# Patient Record
Sex: Male | Born: 1945 | Race: White | Hispanic: No | Marital: Married | Smoking: Former smoker
Health system: Southern US, Community
[De-identification: ages and names within clinical notes are randomized; demographics above are authoritative.]

## PROBLEM LIST (undated history)

## (undated) DIAGNOSIS — M79605 Pain in left leg: Secondary | ICD-10-CM

## (undated) DIAGNOSIS — M79604 Pain in right leg: Secondary | ICD-10-CM

## (undated) DIAGNOSIS — M4800 Spinal stenosis, site unspecified: Secondary | ICD-10-CM

## (undated) HISTORY — DX: Spinal stenosis, site unspecified: M48.00

## (undated) HISTORY — DX: Pain in right leg: M79.604

## (undated) HISTORY — DX: Pain in left leg: M79.605

---
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
## 2011-04-18 ENCOUNTER — Encounter: Payer: Self-pay | Admitting: Gastroenterology

## 2011-05-02 ENCOUNTER — Ambulatory Visit (AMBULATORY_SURGERY_CENTER): Payer: Medicare Other | Admitting: *Deleted

## 2011-05-02 VITALS — Ht 72.0 in | Wt 234.1 lb

## 2011-05-02 DIAGNOSIS — Z1211 Encounter for screening for malignant neoplasm of colon: Secondary | ICD-10-CM

## 2011-05-02 MED ORDER — MOVIPREP 100 G PO SOLR
ORAL | Status: DC
Start: 2011-05-02 — End: 2011-05-21

## 2011-05-03 ENCOUNTER — Encounter: Payer: Self-pay | Admitting: Gastroenterology

## 2011-05-16 ENCOUNTER — Encounter: Payer: Self-pay | Admitting: Gastroenterology

## 2011-05-21 ENCOUNTER — Encounter: Payer: Self-pay | Admitting: Gastroenterology

## 2011-05-21 ENCOUNTER — Ambulatory Visit (AMBULATORY_SURGERY_CENTER): Payer: Medicare Other | Admitting: Gastroenterology

## 2011-05-21 VITALS — BP 140/84 | HR 66 | Temp 97.6°F | Resp 20

## 2011-05-21 DIAGNOSIS — Z1211 Encounter for screening for malignant neoplasm of colon: Secondary | ICD-10-CM

## 2011-05-21 MED ORDER — SODIUM CHLORIDE 0.9 % IV SOLN: 500.0000 mL | INTRAVENOUS | Status: DC

## 2011-05-21 NOTE — Patient Instructions (Signed)
Please follow discharge instructions given today. Resume current medications today. Repeat colonoscopy in 10 years. Diverticulosis seen, try to follow high fiber diet with liberal fluid intake. See handouts given. Call us with any questions or concerns. Thank you!!

## 2011-05-21 NOTE — Progress Notes (Signed)
Patient did not experience any of the following events: a burn prior to discharge; a fall within the facility; wrong site/side/patient/procedure/implant event; or a hospital transfer or hospital admission upon discharge from the facility. (G8907) Patient did not have preoperative order for IV antibiotic SSI prophylaxis. (G8918)  

## 2011-05-21 NOTE — Op Note (Signed)
[facility] [address] [place], [place]  [address]  COLONOSCOPY PROCEDURE REPORT  PATIENT:  Johnny Golden, Johnny Golden  MR#:  #[id number] BIRTHDATE:  08-12-1945, 65 yrs. old  GENDER:  male ENDOSCOPIST:  Vania Rea. Jarold Motto, MD, [facility] REF. BY: PROCEDURE DATE:  05/21/2011 PROCEDURE:  Average-risk screening colonoscopy G0121 ASA CLASS:  Class II INDICATIONS:  Routine Risk Screening MEDICATIONS:   propofol (Diprivan) 250  DESCRIPTION OF PROCEDURE:   After the risks and benefits and of the procedure were explained, informed consent was obtained. Digital rectal exam was performed and revealed no abnormalities. The LB CF-[id number] endoscope was introduced through the anus and advanced to the cecum, which was identified by both the appendix and ileocecal valve.  The quality of the prep was excellent, using MoviPrep.  The instrument was then slowly withdrawn as the colon was fully examined. <<PROCEDUREIMAGES>>  FINDINGS:  There were mild diverticular changes in left colon. diverticulosis was found.  No polyps or cancers were seen. Retroflexed views in the rectum revealed no abnormalities.    The scope was then withdrawn from the patient and the procedure completed.  COMPLICATIONS:  None ENDOSCOPIC IMPRESSION: 1) Diverticulosis,mild,left sided diverticulosis 2) No polyps or cancers RECOMMENDATIONS: 1) Continue current colorectal screening recommendations for "routine risk" patients with a repeat colonoscopy in 10 years.  REPEAT EXAM:  No  ______________________________ Vania Rea. Jarold Motto, MD, [facility]  CC:  n. eSIGNED:   Vania Rea. Patterson at 05/21/2011 10:49 AM  Ladona Horns, #[id number]

## 2011-05-22 ENCOUNTER — Telehealth: Payer: Self-pay

## 2011-05-22 NOTE — Telephone Encounter (Signed)

## 2016-05-15 ENCOUNTER — Ambulatory Visit: Payer: Self-pay | Admitting: Physician Assistant

## 2016-05-28 ENCOUNTER — Encounter: Payer: Self-pay | Admitting: Neurology

## 2016-05-28 ENCOUNTER — Ambulatory Visit (INDEPENDENT_AMBULATORY_CARE_PROVIDER_SITE_OTHER): Payer: Medicare Other | Admitting: Neurology

## 2016-05-28 DIAGNOSIS — M48061 Spinal stenosis, lumbar region without neurogenic claudication: Secondary | ICD-10-CM | POA: Insufficient documentation

## 2016-05-28 DIAGNOSIS — M48062 Spinal stenosis, lumbar region with neurogenic claudication: Secondary | ICD-10-CM | POA: Diagnosis not present

## 2016-05-28 NOTE — Progress Notes (Signed)
PATIENT: Johnny Golden DOB: [birth year]  Chief Complaint  Patient presents with  . Peripheral Neuropathy    He is here with his wife, Zella Ball.  He was diagnosed with spinal stenosis two years ago.  He has only been treated with oral steroids twice.  His bilateral, lower extremity pain has worsened and he also notes weakness in his left leg.  Marland Kitchen PCP    Colon Branch, MD  . Orthopedic    Venita Lick, MD     HISTORICAL  Johnny Golden is a 71 year old right-handed male, seen in refer by  orthopedic surgeon Venita Lick, for evaluation of lumbar stenosis, bilateral lower extremity paresthesia, rule out peripheral neuropathy, his primary care physician will be Dr., Joette Catching, initial evaluation was on May 28 2016.  He had low back pain since 1992, he presented with left-sided low back pain radiating pain to left lower extremity, he was treated with one round of by mouth steroid, had dramatic response, he was diagnosis with lumbar stenosis, was offered surgery then. But because his significant improvement with steroid treatment, he did not seek further medical advice.  Later years, he had occasionally recurrent left-sided low back pain, radiating pain to left lower extremity, was treated with chiropractor, he began to notice left calf muscle atrophy around 2 703.  He began to noticed bilateral lower extremity radiating pain since 2013, gradually getting more frequent, he was treated by pain management Dr. Ethelene Hal around that time, his symptoms again improved by by mouth steroids. But around that time, he noticed right calf muscle atrophy.  He began to have urinary incontinence since 2015, occasionally bowel incontinence, now has to wear pads daily since 2017.  He was reevaluated by orthopedic clinic, EMG nerve conduction study on March 10 2016, by Dr. Silverio Lay, bilateral sural sensory responses showed robust the snap amplitude, with mildly prolonged peak latency, this could due to  cold limb temperature, bilateral tibial motor responses showed severely decreased C map amplitude, left peroneal motor response was absent. Right peroneal motor responses were within normal limits.  Selected needle examination of bilateral lower extremity showed evidence of chronic neuropathic changes, there was some active denervation is at her right medial gastrocnemius, there was no spontaneous activity at bilateral lumbar sacral paraspinal muscles.  He was evaluated by Dr. Shon Baton most recent in December 2017, lumbar decompression surgery is planned for February 2018, he is referred here to rule out peripheral neuropathy.  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, L5-S1 mild retrolisthesis and multifactorial congenital and acquired to severe central canal stenosis with obliteration of the dural sac and S1 nerve roots, there is also severe bilateral neuroforaminal stenosis with mass effect on the existing L5 nerve roots, L4-5 multifactorial congenital and acquired to moderate to severe central canal stenosis without mass effect on the nerve roots,   REVIEW OF SYSTEMS: Full 14 system review of systems performed and notable only for numbness, snoring, incontinence, impotence  ALLERGIES: No Known Allergies  HOME MEDICATIONS: No current outpatient prescriptions on file.   No current facility-administered medications for this visit.     PAST MEDICAL HISTORY: Past Medical History:  Diagnosis Date  . Leg pain, bilateral   . Spinal stenosis     PAST SURGICAL HISTORY: Past Surgical History:  Procedure Laterality Date  . TONSILLECTOMY AND ADENOIDECTOMY  1951    FAMILY HISTORY: Family History  Problem  Relation Age of Onset  . Esophageal cancer Mother   . Other Father     Unsure of history    SOCIAL HISTORY:  Social History   Social History  . Marital status: Married    Spouse  name: N/A  . Number of children: 3  . Years of education: College   Occupational History  . Retired    Social History Main Topics  . Smoking status: Former Smoker    Types: Cigarettes    Quit date: 06/01/2002  . Smokeless tobacco: Never Used  . Alcohol use Yes     Comment: 2-3 beers per week  . Drug use: No  . Sexual activity: Not on file   Other Topics Concern  . Not on file   Social History Narrative   Lives at home with wife.   Right-handed.   2 cups caffeine per day.     PHYSICAL EXAM   Vitals:   05/28/16 1023  BP: 132/84  Pulse: 80  Weight: 241 lb (109.3 kg)  Height: 6' (1.829 m)    Not recorded      Body mass index is 32.69 kg/m.  PHYSICAL EXAMNIATION:  Gen: NAD, conversant, well nourised, obese, well groomed                     Cardiovascular: Regular rate rhythm, no peripheral edema, warm, nontender. Eyes: Conjunctivae clear without exudates or hemorrhage Neck: Supple, no carotid bruits. Pulmonary: Clear to auscultation bilaterally   NEUROLOGICAL EXAM:  MENTAL STATUS: Speech:    Speech is normal; fluent and spontaneous with normal comprehension.  Cognition:     Orientation to time, place and person     Normal recent and remote memory     Normal Attention span and concentration     Normal Language, naming, repeating,spontaneous speech     Fund of knowledge   CRANIAL NERVES: CN II: Visual fields are full to confrontation. Fundoscopic exam is normal with sharp discs and no vascular changes. Pupils are round equal and briskly reactive to light. CN III, IV, VI: extraocular movement are normal. No ptosis. CN V: Facial sensation is intact to pinprick in all 3 divisions bilaterally. Corneal responses are intact.  CN VII: Face is symmetric with normal eye closure and smile. CN VIII: Hearing is normal to rubbing fingers CN IX, X: Palate elevates symmetrically. Phonation is normal. CN XI: Head turning and shoulder shrug are intact CN XII: Tongue is  midline with normal movements and no atrophy.  MOTOR: Significant bilateral calf muscle atrophy, left worse than right, right Hammer toes, mild left toe extension flexion weakness   REFLEXES: Reflexes are 2+ and symmetric at the biceps, triceps, knees, and ankles. Plantar responses are flexor.  SENSORY: Length dependent decreased to  light touch, pinprick and vibratory sensation to distal leg   COORDINATION: Rapid alternating movements and fine finger movements are intact. There is no dysmetria on finger-to-nose and heel-knee-shin.    GAIT/STANCE: Wide based, mildly unsteady gait, he is able to step up on heels, but not on tip toes.    DIAGNOSTIC DATA (LABS, IMAGING, TESTING) - I reviewed patient records, labs, notes, testing and imaging myself where available.   ASSESSMENT AND PLAN  Hampton AbbotJohn M Fajardo is a 71 y.o. male    chronic low back pain radiating pain to bilateral lower extremity, bilateral calf muscle atrophy,   severe L5-S1 canal stenosis, and bilateral foraminal stenosis, moderate L4-L5 canal, and foraminal stenosis.  The well-preserved bilateral  sural sensory snaps argue against the length dependent peripheral neuropathy,  his symptoms are most consistent with lumbar stenosis, severe bilateral S1 radiculopathy.  He would definitely benefit lumbosacral decompression surgery  Levert Feinstein, M.D. Ph.D.  [facility] [address], [address] [place], [place] [address] Ph: [phone] Fax: [phone]  CC: Venita Lick, MD, Joette Catching, MD

## 2016-06-08 ENCOUNTER — Inpatient Hospital Stay (HOSPITAL_COMMUNITY): Admission: RE | Admit: 2016-06-08 | Payer: Self-pay | Source: Ambulatory Visit

## 2016-06-08 ENCOUNTER — Encounter (HOSPITAL_COMMUNITY)
Admission: RE | Admit: 2016-06-08 | Discharge: 2016-06-08 | Disposition: A | Discharge status: Home / Self Care | Payer: Medicare Other | Source: Ambulatory Visit | Attending: Orthopedic Surgery | Admitting: Orthopedic Surgery

## 2016-06-08 ENCOUNTER — Encounter (HOSPITAL_COMMUNITY): Payer: Self-pay

## 2016-06-08 DIAGNOSIS — Z87891 Personal history of nicotine dependence: Secondary | ICD-10-CM | POA: Insufficient documentation

## 2016-06-08 DIAGNOSIS — Z01818 Encounter for other preprocedural examination: Secondary | ICD-10-CM | POA: Insufficient documentation

## 2016-06-08 DIAGNOSIS — M4807 Spinal stenosis, lumbosacral region: Secondary | ICD-10-CM | POA: Insufficient documentation

## 2016-06-08 LAB — BASIC METABOLIC PANEL
Anion gap: 8 (ref 5–15)
BUN: 18 mg/dL (ref 6–20)
CALCIUM: 9.5 mg/dL (ref 8.9–10.3)
CO2: 24 mmol/L (ref 22–32)
CREATININE: 1 mg/dL (ref 0.61–1.24)
Chloride: 105 mmol/L (ref 101–111)
GFR calc Af Amer: >60 mL/min (ref >60)
GLUCOSE: 84 mg/dL (ref 65–99)
Potassium: 4.4 mmol/L (ref 3.5–5.1)
SODIUM: 137 mmol/L (ref 135–145)

## 2016-06-08 LAB — CBC
HCT: 46.7 % (ref 39.0–52.0)
Hemoglobin: 15.7 g/dL (ref 13.0–17.0)
MCH: 31.1 pg (ref 26.0–34.0)
MCHC: 33.6 g/dL (ref 30.0–36.0)
MCV: 92.5 fL (ref 78.0–100.0)
PLATELETS: 188 10*3/uL (ref 150–400)
RBC: 5.05 MIL/uL (ref 4.22–5.81)
RDW: 13.9 % (ref 11.5–15.5)
WBC: 6.8 10*3/uL (ref 4.0–10.5)

## 2016-06-08 LAB — SURGICAL PCR SCREEN
MRSA, PCR: NEGATIVE
Staphylococcus aureus: NEGATIVE

## 2016-06-08 NOTE — Pre-Procedure Instructions (Signed)
Johnny Golden  06/08/2016      [facility] [place] - [address] [place] [address] Phone: [phone] Fax: [phone]    Your procedure is scheduled on   Thursday  06/14/16  [facility] at 530 [facility].M.  Call this number if you have problems the morning of surgery:  [phone]   Remember:  Do not eat food or drink liquids after midnight.  Take these medicines the morning of surgery with A SIP OF WATER   NONE   (STOP 7 DAYS PRIOR TO SURGERY ASPIRIN , IBUPROFEN/ ADVIL/ MOTRIN, GOODY POWDERS, BC'S, HERBAL MEDICINES)   Do not wear jewelry, make-up or nail polish.  Do not wear lotions, powders, or perfumes, or deoderant.  Do not shave 48 hours prior to surgery.  Men may shave face and neck.  Do not bring valuables to the hospital.  [facility] is not responsible for any belongings or valuables.  Contacts, dentures or bridgework may not be worn into surgery.  Leave your suitcase in the car.  After surgery it may be brought to your room.  For patients admitted to the hospital, discharge time will be determined by your treatment team.  Patients discharged the day of surgery will not be allowed to drive home.   Name and phone number of your driver:    Special instructions:  [place] - Preparing for Surgery  Before surgery, you can play an important role.  Because skin is not sterile, your skin needs to be as free of germs as possible.  You can reduce the number of germs on you skin by washing with CHG (chlorahexidine gluconate) soap before surgery.  CHG is an antiseptic cleaner which kills germs and bonds with the skin to continue killing germs even after washing.  Please DO NOT use if you have an allergy to CHG or antibacterial soaps.  If your skin becomes reddened/irritated stop using the CHG and inform your nurse when you arrive at Short Stay.  Do not shave (including legs and underarms) for at least 48 hours  prior to the first CHG shower.  You may shave your face.  Please follow these instructions carefully:   1.  Shower with CHG Soap the night before surgery and the                                morning of Surgery.  2.  If you choose to wash your hair, wash your hair first as usual with your       normal shampoo.  3.  After you shampoo, rinse your hair and body thoroughly to remove the                      Shampoo.  4.  Use CHG as you would any other liquid soap.  You can apply chg directly       to the skin and wash gently with scrungie or a clean washcloth.  5.  Apply the CHG Soap to your body ONLY FROM THE NECK DOWN.        Do not use on open wounds or open sores.  Avoid contact with your eyes,       ears, mouth and genitals (private parts).  Wash genitals (private parts)       with your normal soap.  6.  Wash thoroughly, paying special attention to the area where your surgery        will be performed.  7.  Thoroughly rinse your body with warm water from the neck down.  8.  DO NOT shower/wash with your normal soap after using and rinsing off       the CHG Soap.  9.  Pat yourself dry with a clean towel.            10.  Wear clean pajamas.            11.  Place clean sheets on your bed the night of your first shower and do not        sleep with pets.  Day of Surgery  Do not apply any lotions/deoderants the morning of surgery.  Please wear clean clothes to the hospital/surgery center.    Please read over the following fact sheets that you were given. MRSA Information and Surgical Site Infection Prevention

## 2016-06-11 NOTE — Progress Notes (Signed)
Anesthesia Chart Review:  Pt is a 71 year old male scheduled for L3-S1 lumbar laminectomy/ decompression microdiscectomy on 06/14/2016 with Venita Lickahari Brooks, MD  - PCP is Joette CatchingLeonard Nyland, MD  PMH includes:  Spinal stenosisFormer smoker. BMI 33.   Medications reviewed.   Preoperative labs reviewed.  K 4.4, hemolyzed specimen. Will get I-stat 4 DOS.   If labs acceptable DOS, I anticipate pt can proceed as scheduled.   Rica Mastngela Kabbe, FNP-BC [facility]/Anesthesiology Phone: [phone] 06/11/2016 3:52 PM

## 2016-06-13 NOTE — Anesthesia Preprocedure Evaluation (Addendum)
Anesthesia Evaluation  Patient identified by MRN, date of birth, ID band Patient awake    Reviewed: Allergy & Precautions, H&P , NPO status , Patient's Chart, lab work & pertinent test results  Airway Mallampati: II  TM Distance: >3 FB Neck ROM: Full    Dental no notable dental hx. (+) Teeth Intact, Dental Advisory Given   Pulmonary neg pulmonary ROS, former smoker,    Pulmonary exam normal breath sounds clear to auscultation       Cardiovascular Exercise Tolerance: Good negative cardio ROS   Rhythm:Regular Rate:Normal     Neuro/Psych negative neurological ROS  negative psych ROS   GI/Hepatic negative GI ROS, Neg liver ROS,   Endo/Other  negative endocrine ROS  Renal/GU negative Renal ROS  negative genitourinary   Musculoskeletal   Abdominal   Peds  Hematology negative hematology ROS (+)   Anesthesia Other Findings   Reproductive/Obstetrics negative OB ROS                            Anesthesia Physical Anesthesia Plan  ASA: II  Anesthesia Plan: General   Post-op Pain Management:    Induction: Intravenous  Airway Management Planned: Oral ETT  Additional Equipment:   Intra-op Plan:   Post-operative Plan: Extubation in OR  Informed Consent: I have reviewed the patients History and Physical, chart, labs and discussed the procedure including the risks, benefits and alternatives for the proposed anesthesia with the patient or authorized representative who has indicated his/her understanding and acceptance.   Dental advisory given  Plan Discussed with: CRNA  Anesthesia Plan Comments:         Anesthesia Quick Evaluation

## 2016-06-14 ENCOUNTER — Encounter (HOSPITAL_COMMUNITY)
Admission: RE | Disposition: A | Discharge status: Home / Self Care | Payer: Self-pay | Source: Ambulatory Visit | Attending: Orthopedic Surgery

## 2016-06-14 ENCOUNTER — Ambulatory Visit (HOSPITAL_COMMUNITY): Payer: Medicare Other | Admitting: Emergency Medicine

## 2016-06-14 ENCOUNTER — Encounter (HOSPITAL_COMMUNITY): Payer: Self-pay | Admitting: Surgery

## 2016-06-14 ENCOUNTER — Observation Stay (HOSPITAL_COMMUNITY)
Admission: RE | Admit: 2016-06-14 | Discharge: 2016-06-15 | Disposition: A | Discharge status: Home / Self Care | Payer: Medicare Other | Source: Ambulatory Visit | Attending: Orthopedic Surgery | Admitting: Orthopedic Surgery

## 2016-06-14 ENCOUNTER — Ambulatory Visit (HOSPITAL_COMMUNITY): Payer: Medicare Other

## 2016-06-14 DIAGNOSIS — M5442 Lumbago with sciatica, left side: Secondary | ICD-10-CM | POA: Diagnosis not present

## 2016-06-14 DIAGNOSIS — M48062 Spinal stenosis, lumbar region with neurogenic claudication: Secondary | ICD-10-CM | POA: Diagnosis not present

## 2016-06-14 DIAGNOSIS — M549 Dorsalgia, unspecified: Secondary | ICD-10-CM | POA: Diagnosis present

## 2016-06-14 DIAGNOSIS — Z87891 Personal history of nicotine dependence: Secondary | ICD-10-CM | POA: Insufficient documentation

## 2016-06-14 DIAGNOSIS — M5441 Lumbago with sciatica, right side: Secondary | ICD-10-CM | POA: Diagnosis not present

## 2016-06-14 DIAGNOSIS — Z419 Encounter for procedure for purposes other than remedying health state, unspecified: Secondary | ICD-10-CM

## 2016-06-14 HISTORY — PX: LUMBAR LAMINECTOMY/DECOMPRESSION MICRODISCECTOMY: SHX5026

## 2016-06-14 LAB — POCT I-STAT 4, (NA,K, GLUC, HGB,HCT)
Glucose, Bld: 106 mg/dL — ABNORMAL HIGH (ref 65–99)
HEMATOCRIT: 45 % (ref 39.0–52.0)
HEMOGLOBIN: 15.3 g/dL (ref 13.0–17.0)
Potassium: 4.2 mmol/L (ref 3.5–5.1)
Sodium: 143 mmol/L (ref 135–145)

## 2016-06-14 SURGERY — LUMBAR LAMINECTOMY/DECOMPRESSION MICRODISCECTOMY 3 LEVELS
Anesthesia: General | Site: Spine Lumbar

## 2016-06-14 MED ORDER — POLYETHYLENE GLYCOL 3350 17 G PO PACK: 17.0000 g | PACK | Freq: Every day | ORAL | Status: DC | PRN

## 2016-06-14 MED ORDER — MIDAZOLAM HCL 2 MG/2ML IJ SOLN
INTRAMUSCULAR | Status: AC
  Filled 2016-06-14: qty 2

## 2016-06-14 MED ORDER — FENTANYL CITRATE (PF) 100 MCG/2ML IJ SOLN
INTRAMUSCULAR | Status: AC
  Filled 2016-06-14: qty 2

## 2016-06-14 MED ORDER — MIDAZOLAM HCL 5 MG/5ML IJ SOLN
INTRAMUSCULAR | Status: DC | PRN
  Administered 2016-06-14: 2 mg via INTRAVENOUS

## 2016-06-14 MED ORDER — THROMBIN 20000 UNITS EX KIT
PACK | CUTANEOUS | Status: DC | PRN
  Administered 2016-06-14: 20000 [IU] via TOPICAL

## 2016-06-14 MED ORDER — PHENOL 1.4 % MT LIQD: 1.0000 | OROMUCOSAL | Status: DC | PRN

## 2016-06-14 MED ORDER — DEXAMETHASONE 4 MG PO TABS
4.0000 mg | ORAL_TABLET | Freq: Four times a day (QID) | ORAL | Status: AC
  Administered 2016-06-14 (×2): 4 mg via ORAL
  Filled 2016-06-14 (×2): qty 1

## 2016-06-14 MED ORDER — PHENYLEPHRINE 40 MCG/ML (10ML) SYRINGE FOR IV PUSH (FOR BLOOD PRESSURE SUPPORT)
PREFILLED_SYRINGE | INTRAVENOUS | Status: AC
  Filled 2016-06-14: qty 10

## 2016-06-14 MED ORDER — HYDROMORPHONE HCL 1 MG/ML IJ SOLN: 0.2500 mg | INTRAMUSCULAR | Status: DC | PRN

## 2016-06-14 MED ORDER — PHENYLEPHRINE HCL 10 MG/ML IJ SOLN
INTRAMUSCULAR | Status: DC | PRN
  Administered 2016-06-14 (×3): 80 ug via INTRAVENOUS

## 2016-06-14 MED ORDER — DEXAMETHASONE SODIUM PHOSPHATE 4 MG/ML IJ SOLN: 4.0000 mg | Freq: Four times a day (QID) | INTRAMUSCULAR | Status: AC

## 2016-06-14 MED ORDER — ROCURONIUM BROMIDE 50 MG/5ML IV SOSY
PREFILLED_SYRINGE | INTRAVENOUS | Status: AC
  Filled 2016-06-14: qty 5

## 2016-06-14 MED ORDER — MORPHINE SULFATE (PF) 2 MG/ML IV SOLN: 2.0000 mg | INTRAVENOUS | Status: DC | PRN

## 2016-06-14 MED ORDER — ACETAMINOPHEN 325 MG PO TABS: 650.0000 mg | ORAL_TABLET | ORAL | Status: DC | PRN

## 2016-06-14 MED ORDER — ACETAMINOPHEN 650 MG RE SUPP: 650.0000 mg | RECTAL | Status: DC | PRN

## 2016-06-14 MED ORDER — ONDANSETRON HCL 4 MG PO TABS: 4.0000 mg | ORAL_TABLET | Freq: Three times a day (TID) | ORAL | 0 refills | Status: AC | PRN

## 2016-06-14 MED ORDER — OXYCODONE HCL 5 MG PO TABS
10.0000 mg | ORAL_TABLET | ORAL | Status: DC | PRN
  Administered 2016-06-14 – 2016-06-15 (×4): 10 mg via ORAL
  Filled 2016-06-14 (×4): qty 2

## 2016-06-14 MED ORDER — LIDOCAINE HCL (CARDIAC) 20 MG/ML IV SOLN
INTRAVENOUS | Status: DC | PRN
  Administered 2016-06-14: 60 mg via INTRAVENOUS

## 2016-06-14 MED ORDER — MENTHOL 3 MG MT LOZG: 1.0000 | LOZENGE | OROMUCOSAL | Status: DC | PRN

## 2016-06-14 MED ORDER — EPHEDRINE 5 MG/ML INJ
INTRAVENOUS | Status: AC
  Filled 2016-06-14: qty 10

## 2016-06-14 MED ORDER — LACTATED RINGERS IV SOLN
INTRAVENOUS | Status: DC | PRN
  Administered 2016-06-14 (×3): via INTRAVENOUS

## 2016-06-14 MED ORDER — LACTATED RINGERS IV SOLN: INTRAVENOUS | Status: DC

## 2016-06-14 MED ORDER — CEFAZOLIN SODIUM-DEXTROSE 2-4 GM/100ML-% IV SOLN
2.0000 g | INTRAVENOUS | Status: AC
  Administered 2016-06-14 (×2): 2 g via INTRAVENOUS
  Filled 2016-06-14: qty 100

## 2016-06-14 MED ORDER — PROPOFOL 10 MG/ML IV BOLUS
INTRAVENOUS | Status: AC
  Filled 2016-06-14: qty 20

## 2016-06-14 MED ORDER — SODIUM CHLORIDE 0.9% FLUSH: 3.0000 mL | INTRAVENOUS | Status: DC | PRN

## 2016-06-14 MED ORDER — HYDROCODONE-ACETAMINOPHEN 10-325 MG PO TABS: 1.0000 | ORAL_TABLET | Freq: Four times a day (QID) | ORAL | 0 refills | Status: DC | PRN

## 2016-06-14 MED ORDER — EPHEDRINE SULFATE 50 MG/ML IJ SOLN
INTRAMUSCULAR | Status: DC | PRN
  Administered 2016-06-14 (×2): 10 mg via INTRAVENOUS

## 2016-06-14 MED ORDER — METHOCARBAMOL 500 MG PO TABS
500.0000 mg | ORAL_TABLET | Freq: Four times a day (QID) | ORAL | Status: DC | PRN
  Administered 2016-06-14 – 2016-06-15 (×3): 500 mg via ORAL
  Filled 2016-06-14 (×4): qty 1

## 2016-06-14 MED ORDER — PROPOFOL 10 MG/ML IV BOLUS
INTRAVENOUS | Status: DC | PRN
  Administered 2016-06-14: 130 mg via INTRAVENOUS
  Administered 2016-06-14: 40 mg via INTRAVENOUS

## 2016-06-14 MED ORDER — METHOCARBAMOL 500 MG PO TABS: 500.0000 mg | ORAL_TABLET | Freq: Three times a day (TID) | ORAL | 0 refills | Status: DC | PRN

## 2016-06-14 MED ORDER — HEMOSTATIC AGENTS (NO CHARGE) OPTIME
TOPICAL | Status: DC | PRN
  Administered 2016-06-14 (×2): 1 via TOPICAL

## 2016-06-14 MED ORDER — DEXAMETHASONE SODIUM PHOSPHATE 10 MG/ML IJ SOLN
INTRAMUSCULAR | Status: AC
  Filled 2016-06-14: qty 1

## 2016-06-14 MED ORDER — METHOCARBAMOL 1000 MG/10ML IJ SOLN
500.0000 mg | Freq: Four times a day (QID) | INTRAVENOUS | Status: DC | PRN
  Filled 2016-06-14: qty 5

## 2016-06-14 MED ORDER — SUGAMMADEX SODIUM 500 MG/5ML IV SOLN
INTRAVENOUS | Status: DC | PRN
  Administered 2016-06-14: 200 mg via INTRAVENOUS

## 2016-06-14 MED ORDER — BUPIVACAINE-EPINEPHRINE 0.25% -1:200000 IJ SOLN
INTRAMUSCULAR | Status: DC | PRN
  Administered 2016-06-14: 10 mL

## 2016-06-14 MED ORDER — ACETAMINOPHEN 10 MG/ML IV SOLN
1000.0000 mg | Freq: Four times a day (QID) | INTRAVENOUS | Status: DC
  Administered 2016-06-14 (×2): 1000 mg via INTRAVENOUS
  Filled 2016-06-14 (×2): qty 100

## 2016-06-14 MED ORDER — DEXAMETHASONE SODIUM PHOSPHATE 10 MG/ML IJ SOLN
INTRAMUSCULAR | Status: DC | PRN
  Administered 2016-06-14: 10 mg via INTRAVENOUS

## 2016-06-14 MED ORDER — SODIUM CHLORIDE 0.9% FLUSH
3.0000 mL | Freq: Two times a day (BID) | INTRAVENOUS | Status: DC
  Administered 2016-06-14 (×2): 3 mL via INTRAVENOUS

## 2016-06-14 MED ORDER — ONDANSETRON HCL 4 MG/2ML IJ SOLN
4.0000 mg | INTRAMUSCULAR | Status: DC | PRN
  Filled 2016-06-14: qty 2

## 2016-06-14 MED ORDER — THROMBIN 20000 UNITS EX SOLR
CUTANEOUS | Status: AC
  Filled 2016-06-14: qty 20000

## 2016-06-14 MED ORDER — ONDANSETRON HCL 4 MG/2ML IJ SOLN
INTRAMUSCULAR | Status: DC | PRN
Start: 2016-06-14 — End: 2016-06-14
  Administered 2016-06-14: 4 mg via INTRAVENOUS

## 2016-06-14 MED ORDER — LIDOCAINE 2% (20 MG/ML) 5 ML SYRINGE
INTRAMUSCULAR | Status: AC
  Filled 2016-06-14: qty 5

## 2016-06-14 MED ORDER — ROCURONIUM BROMIDE 100 MG/10ML IV SOLN
INTRAVENOUS | Status: DC | PRN
  Administered 2016-06-14 (×2): 20 mg via INTRAVENOUS
  Administered 2016-06-14: 50 mg via INTRAVENOUS
  Administered 2016-06-14: 10 mg via INTRAVENOUS
  Administered 2016-06-14: 20 mg via INTRAVENOUS

## 2016-06-14 MED ORDER — CEFAZOLIN SODIUM-DEXTROSE 2-4 GM/100ML-% IV SOLN
2.0000 g | Freq: Three times a day (TID) | INTRAVENOUS | Status: AC
  Administered 2016-06-14 – 2016-06-15 (×2): 2 g via INTRAVENOUS
  Filled 2016-06-14 (×2): qty 100

## 2016-06-14 MED ORDER — FENTANYL CITRATE (PF) 100 MCG/2ML IJ SOLN
INTRAMUSCULAR | Status: DC | PRN
  Administered 2016-06-14 (×3): 50 ug via INTRAVENOUS
  Administered 2016-06-14 (×2): 100 ug via INTRAVENOUS
  Administered 2016-06-14: 50 ug via INTRAVENOUS

## 2016-06-14 SURGICAL SUPPLY — 65 items
BUR EGG ELITE 4.0 (BURR) IMPLANT
BUR EGG ELITE 4.0MM (BURR)
BUR MATCHSTICK NEURO 3.0 LAGG (BURR) IMPLANT
CANISTER SUCTION 2500CC (MISCELLANEOUS) ×3 IMPLANT
CLOSURE STERI-STRIP 1/2X4 (GAUZE/BANDAGES/DRESSINGS) ×1
CLOSURE WOUND 1/2 X4 (GAUZE/BANDAGES/DRESSINGS)
CLSR STERI-STRIP ANTIMIC 1/2X4 (GAUZE/BANDAGES/DRESSINGS) ×2 IMPLANT
CORDS BIPOLAR (ELECTRODE) ×3 IMPLANT
COVER SURGICAL LIGHT HANDLE (MISCELLANEOUS) ×3 IMPLANT
DERMABOND ADVANCED (GAUZE/BANDAGES/DRESSINGS)
DERMABOND ADVANCED .7 DNX12 (GAUZE/BANDAGES/DRESSINGS) IMPLANT
DRAIN CHANNEL 15F RND FF W/TCR (WOUND CARE) ×3 IMPLANT
DRAPE POUCH INSTRU U-SHP 10X18 (DRAPES) ×3 IMPLANT
DRAPE SURG 17X23 STRL (DRAPES) ×3 IMPLANT
DRAPE U-SHAPE 47X51 STRL (DRAPES) ×3 IMPLANT
DRSG AQUACEL AG ADV 3.5X10 (GAUZE/BANDAGES/DRESSINGS) ×3 IMPLANT
DRSG AQUACEL AG ADV 3.5X14 (GAUZE/BANDAGES/DRESSINGS) ×3 IMPLANT
DURAPREP 26ML APPLICATOR (WOUND CARE) ×3 IMPLANT
ELECT BLADE 4.0 EZ CLEAN MEGAD (MISCELLANEOUS)
ELECT CAUTERY BLADE 6.4 (BLADE) ×3 IMPLANT
ELECT PENCIL ROCKER SW 15FT (MISCELLANEOUS) ×3 IMPLANT
ELECT REM PT RETURN 9FT ADLT (ELECTROSURGICAL) ×3
ELECTRODE BLDE 4.0 EZ CLN MEGD (MISCELLANEOUS) IMPLANT
ELECTRODE REM PT RTRN 9FT ADLT (ELECTROSURGICAL) ×1 IMPLANT
EVACUATOR 1/8 PVC DRAIN (DRAIN) IMPLANT
EVACUATOR SILICONE 100CC (DRAIN) ×3 IMPLANT
GLOVE BIO SURGEON STRL SZ 6.5 (GLOVE) ×2 IMPLANT
GLOVE BIO SURGEONS STRL SZ 6.5 (GLOVE) ×1
GLOVE BIOGEL PI IND STRL 6.5 (GLOVE) ×1 IMPLANT
GLOVE BIOGEL PI IND STRL 8.5 (GLOVE) ×1 IMPLANT
GLOVE BIOGEL PI INDICATOR 6.5 (GLOVE) ×2
GLOVE BIOGEL PI INDICATOR 8.5 (GLOVE) ×2
GLOVE SS BIOGEL STRL SZ 8.5 (GLOVE) ×1 IMPLANT
GLOVE SUPERSENSE BIOGEL SZ 8.5 (GLOVE) ×2
GOWN STRL REUS W/ TWL LRG LVL3 (GOWN DISPOSABLE) ×1 IMPLANT
GOWN STRL REUS W/TWL 2XL LVL3 (GOWN DISPOSABLE) ×6 IMPLANT
GOWN STRL REUS W/TWL LRG LVL3 (GOWN DISPOSABLE) ×2
KIT BASIN OR (CUSTOM PROCEDURE TRAY) ×3 IMPLANT
KIT ROOM TURNOVER OR (KITS) ×3 IMPLANT
NEEDLE 22X1 1/2 (OR ONLY) (NEEDLE) ×3 IMPLANT
NEEDLE SPNL 18GX3.5 QUINCKE PK (NEEDLE) ×6 IMPLANT
NS IRRIG 1000ML POUR BTL (IV SOLUTION) ×3 IMPLANT
PACK LAMINECTOMY ORTHO (CUSTOM PROCEDURE TRAY) ×3 IMPLANT
PACK UNIVERSAL I (CUSTOM PROCEDURE TRAY) ×3 IMPLANT
PAD ARMBOARD 7.5X6 YLW CONV (MISCELLANEOUS) ×6 IMPLANT
PATTIES SURGICAL .5 X.5 (GAUZE/BANDAGES/DRESSINGS) IMPLANT
PATTIES SURGICAL .5 X1 (DISPOSABLE) IMPLANT
SPONGE SURGIFOAM ABS GEL 100 (HEMOSTASIS) IMPLANT
STRIP CLOSURE SKIN 1/2X4 (GAUZE/BANDAGES/DRESSINGS) IMPLANT
SURGIFLO W/THROMBIN 8M KIT (HEMOSTASIS) ×6 IMPLANT
SUT BONE WAX W31G (SUTURE) ×3 IMPLANT
SUT MON AB 3-0 SH 27 (SUTURE) ×2
SUT MON AB 3-0 SH27 (SUTURE) ×1 IMPLANT
SUT STRATAFIX 1PDS 45CM VIOLET (SUTURE) ×3 IMPLANT
SUT VIC AB 0 CT1 27 (SUTURE) ×2
SUT VIC AB 0 CT1 27XBRD ANBCTR (SUTURE) ×1 IMPLANT
SUT VIC AB 1 CTX 36 (SUTURE) ×4
SUT VIC AB 1 CTX36XBRD ANBCTR (SUTURE) ×2 IMPLANT
SUT VIC AB 2-0 CT1 18 (SUTURE) ×3 IMPLANT
SYR BULB IRRIGATION 50ML (SYRINGE) ×3 IMPLANT
SYR CONTROL 10ML LL (SYRINGE) ×3 IMPLANT
TOWEL OR 17X24 6PK STRL BLUE (TOWEL DISPOSABLE) ×3 IMPLANT
TOWEL OR 17X26 10 PK STRL BLUE (TOWEL DISPOSABLE) ×3 IMPLANT
WATER STERILE IRR 1000ML POUR (IV SOLUTION) ×3 IMPLANT
YANKAUER SUCT BULB TIP NO VENT (SUCTIONS) ×3 IMPLANT

## 2016-06-14 NOTE — Brief Op Note (Signed)
06/14/2016  11:27 AM  PATIENT:  Johnny BandaJohn M Golden  71 y.o. male  PRE-OPERATIVE DIAGNOSIS:  Lumbar spinal stenosis L3-S1  POST-OPERATIVE DIAGNOSIS:  Spinal Stenosis Lumbar three-Sacral one  PROCEDURE:  Procedure(s) with comments: Lumbar decompression L3-S1 LUMBAR LAMINECTOMY/DECOMPRESSION MICRODISCECTOMY 3 LEVELS (N/A) - Requests 3 hrs  SURGEON:  Surgeon(s) and Role:    * Venita Lickahari Arwyn Besaw, MD - Primary  PHYSICIAN ASSISTANT:   ASSISTANTS: Carmen Mayo   ANESTHESIA:   general  EBL:  Total I/O In: 1000 [I.V.:1000] Out: 1050 [Urine:850; Blood:200]  BLOOD ADMINISTERED:none  DRAINS: 1 drain in back   LOCAL MEDICATIONS USED:  MARCAINE     SPECIMEN:  No Specimen  DISPOSITION OF SPECIMEN:  N/A  COUNTS:  YES  TOURNIQUET:  * No tourniquets in log *  DICTATION: .Other Dictation: Dictation Number [id number]  PLAN OF CARE: Admit for overnight observation  PATIENT DISPOSITION:  PACU - hemodynamically stable.

## 2016-06-14 NOTE — H&P (Signed)
History of Present Illness  The patient is a 71 year old male who comes in today for a preoperative History and Physical. The patient is scheduled for a L3-S1 Lumbar Decompression to be performed by Dr. Debria Garret D. Shon Baton, MD at [facility] on 06/14/2016 . Please see the hospital record for complete dictated history and physical. Pt reports a hx of good health.   Problem List/Past Medical Spinal Stenosis, Lumbar (724.02) (M48.061)  Radicular lumbar or thoracic pain (M54.10)  Chronic right-sided low back pain with bilateral sciatica (M54.41, M54.42)  Sprain/Strain, Lumbar  (Marked as Inactive) Problems Reconciled   Allergies  No Known Drug Allergies  Allergies Reconciled   Family History Cancer  mother and grandfather fathers side Cerebrovascular Accident  grandmother fathers side Congestive Heart Failure  grandmother mothers side  Social History  Tobacco / smoke exposure  no Tobacco use  former smoker; smoke(d) 1 pack(s) per day Alcohol use  current drinker; drinks beer; only occasionally per week Children  0 Current work status  retired Financial planner (Currently)  no Drug/Alcohol Rehab (Previously)  no Exercise  Exercises daily; does running / walking Illicit drug use  no Living situation  live with spouse Marital status  married Number of flights of stairs before winded  greater than 5 Pain Contract  no  Medication History (Sue M. Toomes; 06/05/2016 1:52 PM) No Current Medications  Vitals  06/05/2016 1:57 PM Weight: 244 lb Height: 72.75in Body Surface Area: 2.34 m Body Mass Index: 32.41 kg/m  Temp.: 98.3F  Pulse: 68 (Regular)  BP: 160/105 (Sitting, Left Arm, Standard) w/shoes smt  General General Appearance-Not in acute distress. Orientation-Oriented X3. Build & Nutrition-Well nourished and Well developed.  Integumentary General Characteristics Surgical Scars - no surgical scar evidence of previous lumbar  surgery. Lumbar Spine-Skin examination of the lumbar spine is without deformity, skin lesions, lacerations or abrasions.  Chest and Lung Exam Auscultation Breath sounds - Normal and Clear.  Cardiovascular Auscultation Rhythm - Regular rate and rhythm.  Abdomen Palpation/Percussion Palpation and Percussion of the abdomen reveal - Soft, Non Tender and No Rebound tenderness.  Peripheral Vascular Lower Extremity Palpation - Posterior tibial pulse - Bilateral - 2+. Dorsalis pedis pulse - Bilateral - 2+.  Neurologic Sensation Lower Extremity - Bilateral - sensation is intact in the lower extremity. Reflexes Patellar Reflex - Bilateral - 2+. Achilles Reflex - Bilateral - 2+. Clonus - Bilateral - clonus not present. Hoffman's Sign - Bilateral - Hoffman's sign not present. Testing Seated Straight Leg Raise - Bilateral - Seated straight leg raise negative.  Musculoskeletal Spine/Ribs/Pelvis  Lumbosacral Spine: Inspection and Palpation - Tenderness - left lumbar paraspinals tender to palpation, right lumbar paraspinals tender to palpation, left buttock is tender to palpation and right buttock is tender to palpation. Strength and Tone: Strength - Hip Flexion - Bilateral - 5/5. Knee Extension - Bilateral - 5/5. Knee Flexion - Bilateral - 5/5. Ankle Dorsiflexion - Left - 3/5. Right - 5/5. Ankle Plantarflexion - Bilateral - 5/5. Heel walk - Bilateral - unable to heel walk. Toe Walk - Bilateral - unable to walk on toes. Heel-Toe Walk - Bilateral - able to heel-toe walk without difficulty. ROM - Flexion - mildly decreased range of motion. Extension - moderately decreased range of motion and painful. Left Lateral Bending - moderately decreased range of motion and painful. Right Lateral Bending - moderately decreased range of motion and painful. Right Rotation - moderately decreased range of motion and painful. Left Rotation - moderately decreased range of  motion and painful. Pain - neither flexion or  extension is more painful than the other. Lumbosacral Spine - Waddell's Signs - no Waddell's signs present. Lower Extremity Range of Motion - No true hip, knee or ankle pain with range of motion. Gait and Station - [employer] - no assistive devices.  Imaging from the patient's MRI from 02/23/2016 was compared to 06/19/2013 MRI. He has severe spinal stenosis at L5-S1 with complete obliteration of the canal. No visible sign of the thecal sac at that level with significance sacral nerve root compression. Severe stenosis at L3-4 and L4-5. Mass effect on nerves again, unchanged from 2015.  Assessment & Plan   Posterior Lumbar Decompression/disectomy: Risks of surgery include infection, bleeding, nerve damage, death, stroke, paralysis, failure to heal, need for further surgery, ongoing or worse pain, need for further surgery, CSF leak, loss of bowel or bladder, and recurrent disc herniation or Stenosis which would necessitate need for further surgery.  Goal Of Surgery: Discussed that goal of surgery is to reduce pain and improve function and quality of life. Patient is aware that despite all appropriate treatment that there pain and function could be the same, worse, or different. Chronic right-sided low back pain with bilateral sciatica   Patient with significant spinal stenosis with neurogenic claudication.  Plan on multilevel lumbar decompression.

## 2016-06-14 NOTE — Transfer of Care (Signed)
Immediate Anesthesia Transfer of Care Note  Patient: Johnny BandaJohn M Reppucci  Procedure(s) Performed: Procedure(s) with comments: Lumbar decompression L3-S1 LUMBAR LAMINECTOMY/DECOMPRESSION MICRODISCECTOMY 3 LEVELS (N/A) - Requests 3 hrs  Patient Location: PACU  Anesthesia Type:General  Level of Consciousness: awake  Airway & Oxygen Therapy: Patient Spontanous Breathing and Patient connected to nasal cannula oxygen  Post-op Assessment: Report given to RN and Post -op Vital signs reviewed and stable  Post vital signs: Reviewed and stable  Last Vitals:  Vitals:   06/14/16 0609 06/14/16 1210  BP: (!) 144/76   Pulse: 62   Resp: 20   Temp: 36.8 C (P) 36.5 C    Last Pain:  Vitals:   06/14/16 0609  TempSrc: Oral      Patients Stated Pain Goal: 3 (06/14/16 0644)  Complications: No apparent anesthesia complications

## 2016-06-14 NOTE — Anesthesia Procedure Notes (Signed)
Procedure Name: Intubation Date/Time: 06/14/2016 7:35 AM Performed by: Manus Gunning, Masaichi Kracht J Pre-anesthesia Checklist: Patient identified, Emergency Drugs available, Suction available, Patient being monitored and Timeout performed Patient Re-evaluated:Patient Re-evaluated prior to inductionOxygen Delivery Method: Circle system utilized Preoxygenation: Pre-oxygenation with 100% oxygen Intubation Type: IV induction Ventilation: Mask ventilation without difficulty Laryngoscope Size: Mac and 4 Grade View: Grade II Tube type: Oral Tube size: 7.5 mm Number of attempts: 1 Placement Confirmation: ETT inserted through vocal cords under direct vision,  positive ETCO2 and breath sounds checked- equal and bilateral Secured at: 22 cm Tube secured with: Tape Dental Injury: Teeth and Oropharynx as per pre-operative assessment

## 2016-06-14 NOTE — Anesthesia Postprocedure Evaluation (Signed)
Anesthesia Post Note  Patient: Margit BandaJohn M Trivett  Procedure(s) Performed: Procedure(s) (LRB): Lumbar decompression L3-S1 LUMBAR LAMINECTOMY/DECOMPRESSION MICRODISCECTOMY 3 LEVELS (N/A)  Patient location during evaluation: PACU Anesthesia Type: General Level of consciousness: awake and alert Pain management: pain level controlled Vital Signs Assessment: post-procedure vital signs reviewed and stable Respiratory status: spontaneous breathing, nonlabored ventilation, respiratory function stable and patient connected to nasal cannula oxygen Cardiovascular status: blood pressure returned to baseline and stable Postop Assessment: no signs of nausea or vomiting Anesthetic complications: no       Last Vitals:  Vitals:   06/14/16 1325 06/14/16 1340  BP: 119/78 137/74  Pulse: 85 75  Resp: 13 11  Temp:      Last Pain:  Vitals:   06/14/16 1325  TempSrc:   PainSc: 5                  Johanna Stafford,W. EDMOND

## 2016-06-14 NOTE — Evaluation (Signed)
Occupational Therapy Evaluation and Discharge Patient Details Name: Johnny Golden MRN: [id number] DOB: 08-Aug-1945 Today's Date: 06/14/2016    History of Present Illness Pt is a 71 y.o. male s/p Lumbar decompression L3-S1 LUMBAR LAMINECTOMY/DECOMPRESSION MICRODISCECTOMY 3 LEVELS.    Clinical Impression   Pt reports he was independent with ADL PTA. Currently pt overall min guard for functional mobility and ADL. All back, safety, and ADL education completed with pt and wife. Pt planning to d/c home with 24/7 supervision from family. No further acute OT needs identified; signing off at this time. Please re-consult if needs change. Thank you for this referral.    Follow Up Recommendations  No OT follow up;Supervision/Assistance - 24 hour (initially)    Equipment Recommendations  None recommended by OT    Recommendations for Other Services PT consult     Precautions / Restrictions Precautions Precautions: Back Precaution Booklet Issued: Yes (comment) Precaution Comments: Educated pt and wife on precautions Required Braces or Orthoses: Spinal Brace Spinal Brace: Lumbar corset;Applied in sitting position Restrictions Weight Bearing Restrictions: No      Mobility Bed Mobility Overal bed mobility: Needs Assistance Bed Mobility: Rolling;Sidelying to Sit;Sit to Sidelying Rolling: Supervision Sidelying to sit: Min assist     Sit to sidelying: Min guard General bed mobility comments: Min assist for trunk elevation to sitting. Cues throughout for technique. HOB flat without use of rail. Pt with difficulty performing log roll.  Transfers Overall transfer level: Needs assistance Equipment used: None Transfers: Sit to/from Stand Sit to Stand: Min guard         General transfer comment: for safety     Balance Overall balance assessment: No apparent balance deficits (not formally assessed)                                          ADL Overall ADL's : Needs  assistance/impaired Eating/Feeding: Set up;Sitting   Grooming: Supervision/safety;Standing Grooming Details (indicate cue type and reason): Educated on use of 2 cups for oral care Upper Body Bathing: Set up;Supervision/ safety;Sitting   Lower Body Bathing: Min guard;Sit to/from stand   Upper Body Dressing : Set up;Supervision/safety;Sitting Upper Body Dressing Details (indicate cue type and reason): to don/doff brace Lower Body Dressing: Min guard;Sit to/from stand Lower Body Dressing Details (indicate cue type and reason): Pt able to cross foot over opposite knee. Educated on compensatory strategies for LB ADL. Toilet Transfer: Min guard;Ambulation;Comfort height toilet Toilet Transfer Details (indicate cue type and reason): Simulated by sit to stand from EOB with functional mobility   Toileting - Clothing Manipulation Details (indicate cue type and reason): Educated on proper technique for peri care without twisting and use of wet wipes Tub/ Shower Transfer: Min guard;Walk-in shower;Ambulation;Shower Field seismologistseat Tub/Shower Transfer Details (indicate cue type and reason): Recommend use of shower seat and supervision initially Functional mobility during ADLs: Min guard General ADL Comments: Educated pt on maintaining back precautions during functional activities, keeping frequently used items at counter top height, log roll technique for bed mobility, frequent mobililty throughout the day upon return home.     Vision     Perception     Praxis      Pertinent Vitals/Pain Pain Assessment: Faces Faces Pain Scale: Hurts little more Pain Location: back, incision Pain Descriptors / Indicators: Sore Pain Intervention(s): Monitored during session     Hand Dominance     Extremity/Trunk Assessment  Upper Extremity Assessment Upper Extremity Assessment: Overall WFL for tasks assessed   Lower Extremity Assessment Lower Extremity Assessment: Defer to PT evaluation   Cervical / Trunk  Assessment Cervical / Trunk Assessment: Other exceptions Cervical / Trunk Exceptions: s/p spinal sx   Communication Communication Communication: No difficulties   Cognition Arousal/Alertness: Awake/alert Behavior During Therapy: WFL for tasks assessed/performed Overall Cognitive Status: Within Functional Limits for tasks assessed                     General Comments       Exercises       Shoulder Instructions      Home Living Family/patient expects to be discharged to:: Private residence Living Arrangements: Spouse/significant other Available Help at Discharge: Family;Available 24 hours/day (for first 2 weeks) Type of Home: House Home Access: Stairs to enter [employer] of Steps: 3 Entrance Stairs-Rails: None Home Layout: One level     Bathroom Shower/Tub: Producer, television/film/video: Handicapped height     Home Equipment: Environmental consultant - 2 wheels;Cane - single point;Shower seat - built in          Prior Functioning/Environment Level of Independence: Independent                 OT Problem List:     OT Treatment/Interventions:      OT Goals(Current goals can be found in the care plan section) Acute Rehab OT Goals Patient Stated Goal: return home OT Goal Formulation: All assessment and education complete, DC therapy  OT Frequency:     Barriers to D/C:            Co-evaluation              End of Session Equipment Utilized During Treatment: Back brace Nurse Communication: Mobility status;Other (comment) (no equipment of f/u needs)  Activity Tolerance: Patient tolerated treatment well Patient left: in bed;with call bell/phone within reach;with family/visitor present   Time: 1655-1711 OT Time Calculation (min): 16 min Charges:  OT General Charges $OT Visit: 1 Procedure OT Evaluation $OT Eval Moderate Complexity: 1 Procedure G-Codes: OT G-codes **NOT FOR INPATIENT CLASS** Functional Assessment Tool Used: Clinical  judgement Functional Limitation: Self care Self Care Current Status (G9562): At least 1 percent but less than 20 percent impaired, limited or restricted Self Care Goal Status (Z3086): At least 1 percent but less than 20 percent impaired, limited or restricted Self Care Discharge Status [phone]): At least 1 percent but less than 20 percent impaired, limited or restricted   Gaye Alken M.S., OTR/L Pager: [phone]  06/14/2016, 5:26 PM

## 2016-06-15 ENCOUNTER — Encounter (HOSPITAL_COMMUNITY): Payer: Self-pay | Admitting: Orthopedic Surgery

## 2016-06-15 DIAGNOSIS — M48062 Spinal stenosis, lumbar region with neurogenic claudication: Secondary | ICD-10-CM | POA: Diagnosis not present

## 2016-06-15 MED ORDER — ACETAMINOPHEN 500 MG PO TABS
1000.0000 mg | ORAL_TABLET | Freq: Four times a day (QID) | ORAL | Status: DC
  Administered 2016-06-15 (×2): 1000 mg via ORAL
  Filled 2016-06-15 (×2): qty 2

## 2016-06-15 MED FILL — Thrombin For Soln 20000 Unit: CUTANEOUS | Qty: 1 | Status: AC

## 2016-06-15 NOTE — Progress Notes (Signed)
    Subjective: Procedure(s) (LRB): Lumbar decompression L3-S1 LUMBAR LAMINECTOMY/DECOMPRESSION MICRODISCECTOMY 3 LEVELS (N/A) 1 Day Post-Op  Patient reports pain as 2 on 0-10 scale.  Reports decreased leg pain reports incisional back pain   Negative void (foley just removed) Negative bowel movement Positive flatus Negative chest pain or shortness of breath  Objective: Vital signs in last 24 hours: Temp:  [97.7 F (36.5 C)-99.5 F (37.5 C)] 98.5 F (36.9 C) (02/02 0348) Pulse Rate:  [71-87] 71 (02/02 0348) Resp:  [9-20] 18 (02/02 0348) BP: (109-154)/(60-80) 110/75 (02/02 0348) SpO2:  [94 %-100 %] 97 % (02/02 0348)  Intake/Output from previous day: 02/01 0701 - 02/02 0700 In: 2720 [P.O.:720; I.V.:2000] Out: 3165 [Urine:2700; Drains:265; Blood:200]  Labs:  Recent Labs  06/14/16 0617  HCT 45.0    Recent Labs  06/14/16 0617  NA 143  K 4.2  GLUCOSE 106*   No results for input(s): LABPT, INR in the last 72 hours.  Physical Exam: Neurologically intact ABD soft Intact pulses distally Incision: dressing C/D/I Compartment soft drain output decreasing per shift.  Last empty - 45 this AM  Assessment/Plan: Patient stable  xrays n/a Continue mobilization with physical therapy Continue care  Advance diet Up with therapy  Plan on d/c today Will remove drain and apply dry dressing F/u 2 weeks  Venita Lickahari Raidon Swanner, MD [facility] [phone]

## 2016-06-15 NOTE — Progress Notes (Signed)
Patient alert and oriented, mae's well, voiding adequate amount of urine, swallowing without difficulty, no c/o pain at time of discharge. Patient discharged home with family. Script and discharged instructions given to patient. Patient and family stated understanding of instructions given. Patient has an appointment with Dr. Brooks  

## 2016-06-15 NOTE — Op Note (Signed)
NAMEADONIAS, DEMORE NO.:  [id number]  MEDICAL RECORD NO.:  [id number]  LOCATION:  [facility]                         FACILITY:  [facility]  PHYSICIAN:  Deborh Pense D. Shon Baton, M.D. 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  OPERATIVE PROCEDURE:  Posterior lumbar decompression, L3 to S1, with laminotomy of L3, complete laminectomies L4 and L5 with medial facetectomy and foraminotomies.  COMPLICATIONS:  None.  CONDITION:  Stable.  ESTIMATED BLOOD LOSS:  200 mL.  FIRST ASSISTANT:  Anette Riedel, [place].  HISTORY:  This is a very pleasant, active, 71 year old gentleman who has been having severe debilitating back, buttock, and bilateral leg pain, right worse than the left, and even incontinence of bowel.  Imaging studies confirmed severe spinal stenosis at L3-4, L4-5, and L5-S1. Attempts at conservative management failed to alleviate his symptoms. As such, we elected to proceed with surgery.  All appropriate risks, benefits, and alternatives were discussed with the patient and consent was obtained.  OPERATIVE NOTE:  The patient was brought to the operating room and placed supine on the operating table.  After successful induction of general anesthesia and endotracheal intubation, TEDs, SCDs, and a Foley were inserted.  He was turned prone onto the Wilson frame and all bony prominences were well padded.  The back was then prepped and draped in a standard fashion.  Time-out was taken confirming patient, procedure, and all other pertinent important data.  Once this was completed, 2 needles were placed in the back and an x-ray was taken for localizing incision. Once we localized the incision site, it was  infiltrated with 0.25% Marcaine.  A midline incision was made.  Sharp dissection was carried out down to the deep fascia and I incised the deep fascia and stripped the paraspinal muscles to expose the L3, L4, and L5 spinous processes and then portion of the S1 spinous process.  Once this was completed, I then stripped out over the facet joints and placed my retractors.  A Penfield 4 was placed underneath the lamina of L5 and a second intraoperative x-ray was taken to confirm the L5 lamina.  Once this was done, I removed the L5 spinous process and the L4 spinous process using a double-action Leksell rongeur and and I removed the majority of the L3 spinous process.  At this point, I had my levels for decompression mapped out.  I then used a curved curette to develop a plane underneath the lamina of L5 and used my 3 mm Kerrison punch to perform a generous laminotomy of L5.  This was the area that had the maximum amount of spinal stenosis.  The ligamentum flavum was significantly thickened and there was marked compression of the thecal sac.  I gently dissected through the very thickened ligamentum flavum with a Penfield 4 and then used my 2 mm Kerrison punch to perform a generous central decompression. Because of the severity of the lateral  recess compression, I elected to continue superiorly to complete my central decompression before I went into the lateral recess.  At this point, I had passed the [facility] and neuro patty underneath the central portion of the lamina and then continued my laminectomy of L5.  Once I was nearing the L4 level, I then performed a generous laminotomy of L4 in a similar fashion using the 2 and 3 mm Kerrison punches and then resected the ligamentum flavum to expose the central area of the thecal sac.  I then worked backwards, making sure I had a neuro patty coming from cranial and caudal to completely isolate the remaining portion of the L5 lamina.  I  then completed my L5 laminectomy.  I then went to the L3-4 level and decompressed my central area, performed my laminotomy of L3 and then again passed a patty from cranial and caudal to isolate the remaining portion of L4 and complete my L4 laminectomy.  At this point, I had my central decompression from L3 to S1 completed.  I then began working in the lateral recess.  I was standing on the left-hand side working across into the right lateral recess.  Using a Penfield 4, I gently dissected and exposed the medial facet and the thickened ligamentum flavum and created a plane between it and the thecal sac.  I used my neuro patties to protect the thecal sac and then used my 2 and 3 mm Kerrison punches to resect the osteophytes in the lateral recess.  I decompressed out lateral to the level of the pedicle.  I continued to do this all the way down to the L5 pedicle.  I then identified the L5 nerve root.  I then traced down to the S1 pedicle and decompressed the S1 nerve root into the foramen.  At this point, I had the 3, 4, 5, and S1 foramina on the right side decompressed and the medial facetectomies completed at 3-4, 4- 5, and 5-1.  This completed my lateral recess decompression on the right- hand side.  I then used bipolar electrocautery to obtain hemostasis by coagulating the very large epidural veins.  I then went to the contralateral side, and using the same technique with my Kerrison punches and my Penfield 4 and neuro patties, I decompressed the lateral recess and the foramen to span the L3 to S1 level.  At this point, I copiously irrigated with normal saline and used the bipolar electrocautery and FloSeal to obtain and maintain hemostasis.  I then placed a deep drain and then irrigated copiously with normal saline. FloSeal was irrigated out and then I closed the deep fascia with a running #1 Stratafix barbed suture.  Prior to closure, I did recheck my decompression with a [facility]  elevator and JellicoPenfield confirming that I had an adequate decompression centrally, lateral recess, and in the foramen.  After closing the deep layer, I then closed superficial with a running 0 and interrupted 2-0 Vicryl suture.  3-0 Monocryl was used to close the skin edges.  Steri-Strips and a dry dressing were applied.  The drain was then connected, the patient was extubated, and transferred to PACU without incident.  At the end of the case, all needle and sponge counts were correct.  There were no adverse intraoperative events.     Kiptyn Rafuse D. Shon BatonBrooks, M.D.     DDB/MEDQ  D:  06/14/2016  T:  06/15/2016  Job:  [id number]

## 2016-06-15 NOTE — Evaluation (Signed)
Physical Therapy Evaluation and Discharge Patient Details Name: Johnny Golden MRN: [id number] DOB: 1946-01-08 Today's Date: 06/15/2016   History of Present Illness  Pt is a 71 y.o. male s/p Lumbar decompression L3-S1 LUMBAR LAMINECTOMY/DECOMPRESSION MICRODISCECTOMY 3 LEVELS.   Clinical Impression  Patient evaluated by Physical Therapy with no further acute PT needs identified. All education has been completed and the patient has no further questions. At the time of PT eval pt was able to perform transfers and ambulation with gross supervision for safety to modified independence. See below for any follow-up Physical Therapy or equipment needs. PT is signing off. Thank you for this referral.     Follow Up Recommendations Outpatient PT    Equipment Recommendations  None recommended by PT    Recommendations for Other Services       Precautions / Restrictions Precautions Precautions: Back Precaution Booklet Issued: Yes (comment) Precaution Comments: Educated pt and wife on precautions Required Braces or Orthoses: Spinal Brace Spinal Brace: Lumbar corset;Applied in sitting position Restrictions Weight Bearing Restrictions: No      Mobility  Bed Mobility Overal bed mobility: Modified Independent Bed Mobility: Rolling;Sidelying to Sit Rolling: Modified independent (Device/Increase time) Sidelying to sit: Modified independent (Device/Increase time)       General bed mobility comments: Pt was able to transition to EOB without assist. HOB flat and rails lowered to simulate home environment.   Transfers Overall transfer level: Needs assistance Equipment used: None Transfers: Sit to/from Stand Sit to Stand: Supervision         General transfer comment: Light supervision for safety. No assist required.   Ambulation/Gait Ambulation/Gait assistance: Supervision Ambulation Distance (Feet): 300 Feet Assistive device: None Gait Pattern/deviations: Step-through pattern;Decreased  stride length;Trunk flexed Gait velocity: Decreased Gait velocity interpretation: Below normal speed for age/gender General Gait Details: Pt was able to ambulate fairly well with no assist and no AD. Pt states pain is well controled.   Stairs Stairs: Yes Stairs assistance: Min guard Stair Management: No rails;Step to pattern;Forwards Number of Stairs: 3 General stair comments: Hands-on guarding for safety as pt had no UE support.   Wheelchair Mobility    Modified Rankin (Stroke Patients Only)       Balance Overall balance assessment: No apparent balance deficits (not formally assessed)                                           Pertinent Vitals/Pain Pain Assessment: Faces Faces Pain Scale: Hurts a little bit Pain Location: back, incision Pain Descriptors / Indicators: Sore Pain Intervention(s): Monitored during session    Home Living Family/patient expects to be discharged to:: Private residence Living Arrangements: Spouse/significant other Available Help at Discharge: Family;Available 24 hours/day (for first 2 weeks) Type of Home: House Home Access: Stairs to enter Entrance Stairs-Rails: None Entrance Stairs-Number of Steps: 3 Home Layout: One level Home Equipment: Walker - 2 wheels;Cane - single point;Shower seat - built in      Prior Function Level of Independence: Independent               Hand Dominance   Dominant Hand: Right    Extremity/Trunk Assessment   Upper Extremity Assessment Upper Extremity Assessment: Overall WFL for tasks assessed    Lower Extremity Assessment Lower Extremity Assessment: Overall WFL for tasks assessed    Cervical / Trunk Assessment Cervical / Trunk Assessment: Other exceptions Cervical /  Trunk Exceptions: s/p spinal sx  Communication   Communication: No difficulties  Cognition Arousal/Alertness: Awake/alert Behavior During Therapy: WFL for tasks assessed/performed Overall Cognitive Status:  Within Functional Limits for tasks assessed                      General Comments      Exercises     Assessment/Plan    PT Assessment Patent does not need any further PT services  PT Problem List            PT Treatment Interventions      PT Goals (Current goals can be found in the Care Plan section)  Acute Rehab PT Goals Patient Stated Goal: return home PT Goal Formulation: All assessment and education complete, DC therapy    Frequency     Barriers to discharge        Co-evaluation               End of Session Equipment Utilized During Treatment: Back brace Activity Tolerance: Patient tolerated treatment well Patient left: with call bell/phone within reach;with family/visitor present (Sitting EOB) Nurse Communication: Mobility status    Functional Assessment Tool Used: Clinical judgement Functional Limitation: Mobility: Walking and moving around Mobility: Walking and Moving Around Current Status (Z6109): At least 1 percent but less than 20 percent impaired, limited or restricted Mobility: Walking and Moving Around Goal Status [phone]): At least 1 percent but less than 20 percent impaired, limited or restricted Mobility: Walking and Moving Around Discharge Status [phone]): At least 1 percent but less than 20 percent impaired, limited or restricted    Time: 0821-0833 PT Time Calculation (min) (ACUTE ONLY): 12 min   Charges:   PT Evaluation $PT Eval Moderate Complexity: 1 Procedure     PT G Codes:   PT G-Codes **NOT FOR INPATIENT CLASS** Functional Assessment Tool Used: Clinical judgement Functional Limitation: Mobility: Walking and moving around Mobility: Walking and Moving Around Current Status (B1478): At least 1 percent but less than 20 percent impaired, limited or restricted Mobility: Walking and Moving Around Goal Status [phone]): At least 1 percent but less than 20 percent impaired, limited or restricted Mobility: Walking and Moving Around  Discharge Status [phone]): At least 1 percent but less than 20 percent impaired, limited or restricted    Marylynn Pearson 06/15/2016, 9:38 AM  Conni Slipper, PT, DPT Acute Rehabilitation Services Pager: [phone]

## 2016-06-27 NOTE — Discharge Summary (Signed)
Physician Discharge Summary  Patient ID: Johnny Golden MRN: [id number] DOB/AGE: [birth year] 71 y.o.  Admit date: 06/14/2016 Discharge date: 06/27/2016  Admission Diagnoses:  Lumbar spinal stenosis  Discharge Diagnoses:  Active Problems:   Back pain   Past Medical History:  Diagnosis Date  . Leg pain, bilateral   . Spinal stenosis     Surgeries: Procedure(s): Lumbar decompression L3-S1 LUMBAR LAMINECTOMY/DECOMPRESSION MICRODISCECTOMY 3 LEVELS on 06/14/2016   Consultants (if any):   Discharged Condition: Improved  Hospital Course: Johnny Golden is an 71 y.o. male who was admitted 06/14/2016 with a diagnosis of Lumbar spinal stenosis and went to the operating room on 06/14/2016 and underwent the above named procedures.  Pt reports decreased leg pain.  Pt reports incisional pain controlled on oral medication.   PT and OT cleared pt for DC. Drain removed.  Pt urinating before DC.   He was given perioperative antibiotics:  Anti-infectives    Start     Dose/Rate Route Frequency Ordered Stop   06/14/16 1800  ceFAZolin (ANCEF) IVPB 2g/100 mL premix     2 g 200 mL/hr over 30 Minutes Intravenous Every 8 hours 06/14/16 1418 06/15/16 0225   06/14/16 0527  ceFAZolin (ANCEF) IVPB 2g/100 mL premix     2 g 200 mL/hr over 30 Minutes Intravenous 30 min pre-op 06/14/16 0527 06/14/16 1122    .  He was given sequential compression devices, early ambulation, and TED for DVT prophylaxis.  He benefited maximally from the hospital stay and there were no complications.    Recent vital signs:  Vitals:   06/15/16 0348 06/15/16 0746  BP: 110/75 114/64  Pulse: 71 69  Resp: 18 18  Temp: 98.5 F (36.9 C) 98.8 F (37.1 C)    Recent laboratory studies:  Lab Results  Component Value Date   HGB 15.3 06/14/2016   HGB 15.7 06/08/2016   Lab Results  Component Value Date   WBC 6.8 06/08/2016   PLT 188 06/08/2016   No results found for: INR Lab Results  Component Value Date   NA 143 06/14/2016   K  4.2 06/14/2016   CL 105 06/08/2016   CO2 24 06/08/2016   BUN 18 06/08/2016   CREATININE 1.00 06/08/2016   GLUCOSE 106 (H) 06/14/2016    Discharge Medications:   Allergies as of 06/15/2016      Reactions   No Known Allergies       Medication List    STOP taking these medications   ibuprofen 200 MG tablet Commonly known as:  ADVIL,MOTRIN     TAKE these medications   HYDROcodone-acetaminophen 10-325 MG tablet Commonly known as:  NORCO Take 1 tablet by mouth every 6 (six) hours as needed.   methocarbamol 500 MG tablet Commonly known as:  ROBAXIN Take 1 tablet (500 mg total) by mouth 3 (three) times daily as needed for muscle spasms.   ondansetron 4 MG tablet Commonly known as:  ZOFRAN Take 1 tablet (4 mg total) by mouth every 8 (eight) hours as needed for nausea or vomiting.       Diagnostic Studies: Dg Lumbar Spine 2-3 Views  Result Date: 06/14/2016 CLINICAL DATA:  Lumbar decompression. EXAM: LUMBAR SPINE - 2-3 VIEW COMPARISON:  No prior. FINDINGS: Lumbar vertebra number with the lowest lumbar shaped segmented appearing vertebra on lateral view as L5. Metallic markers noted posteriorly at the L1-L2 and L4-L5 levels on image number 1. On image number 2 metallic marker noted posteriorly at L5-S1. IMPRESSION: Metallic markers  noted posteriorly at the L1-L2 and L4-L5 levels on image number 1. On image number 2 metallic marker noted posteriorly L5-S1. Electronically Signed   By: Maisie Fus  Register   On: 06/14/2016 08:31    Disposition: 01-Home or Self Care Pt will present to clinic in 2 weeks Post op medications provided   Follow-up Information    BROOKS,DAHARI D, MD In 2 weeks.   Specialty:  Orthopedic Surgery Why:  If symptoms worsen, For suture removal, For wound re-check Contact information: [address] [place] [address] [phone]            Signed: Kirt Boys 06/27/2016, 7:27 AM

## 2016-08-02 ENCOUNTER — Ambulatory Visit: Payer: Medicare Other | Attending: Orthopedic Surgery | Admitting: Physical Therapy

## 2016-08-02 DIAGNOSIS — R293 Abnormal posture: Secondary | ICD-10-CM

## 2016-08-02 NOTE — Therapy (Signed)
[facility] [address] [place], [place], [address] Phone: [phone]   Fax:  [phone]  Physical Therapy Evaluation  Patient Details  Name: Johnny Golden MRN: [id number] Date of Birth: June 12, 1945 Referring Provider: Venita Lick MD.  Encounter Date: 08/02/2016      PT End of Session - 08/02/16 1838    Visit Number 1   Number of Visits 8   Date for PT Re-Evaluation 10/01/16   PT Start Time 0900   PT Stop Time 0938   PT Time Calculation (min) 38 min      Past Medical History:  Diagnosis Date  . Leg pain, bilateral   . Spinal stenosis     Past Surgical History:  Procedure Laterality Date  . LUMBAR LAMINECTOMY/DECOMPRESSION MICRODISCECTOMY N/A 06/14/2016   Procedure: Lumbar decompression L3-S1 LUMBAR LAMINECTOMY/DECOMPRESSION MICRODISCECTOMY 3 LEVELS;  Surgeon: Venita Lick, MD;  Location: [facility] OR;  Service: Orthopedics;  Laterality: N/A;  Requests 3 hrs  . TONSILLECTOMY AND ADENOIDECTOMY  1951    There were no vitals filed for this visit.       Subjective Assessment - 08/02/16 1833    Subjective The patient reports severe back and bilateral LE pain prior to undergoing lumbar surgery involving a L3 to S1 lumbar laminectomy/decompression.  He reports no pain since surgery and is now walking a mile a day.  He reports falling once when he stepped in a pothole and once when he slipped on the snow and fell when his feet went out from under him.  He reports no pain today.   Patient Stated Goals Get back to normal life.   Currently in Pain? No/denies            [facility] PT Assessment - 08/02/16 0001      Assessment   Medical Diagnosis Lumbar stenosis.   Referring Provider Venita Lick MD.   Onset Date/Surgical Date --  06/14/16.     Precautions   Precautions --  Please follow protocol.     Restrictions   Weight Bearing Restrictions No     Balance Screen   Has the patient fallen in the past 6 months Yes   How many  times? --  2.   Has the patient had a decrease in activity level because of a fear of falling?  No   Is the patient reluctant to leave their home because of a fear of falling?  No     Home Environment   Living Environment Private residence     Prior Function   Level of Independence Independent     Observation/Other Assessments   Observations Lumbar incision looks good.     Posture/Postural Control   Posture/Postural Control Postural limitations   Postural Limitations Decreased lumbar lordosis     ROM / Strength   AROM / PROM / Strength AROM;Strength     AROM   Overall AROM Comments Normal hip flexion assessed in supine.  Left ankle dorsiflexion to 2 degrees.     Strength   Overall Strength Comments Left ankle dorsiflexion= 4/5.     Palpation   Palpation comment No palpable tenderness.     Special Tests    Special Tests --  Equal leg lengths.     Transfers   Transfers --  independent.     Ambulation/Gait   Gait Comments WNL.                   [facility] Adult PT Treatment/Exercise - 08/02/16 0001  Exercises   Exercises Knee/Hip     Knee/Hip Exercises: Aerobic   Nustep Level 3 x 10 minutes.                  PT Short Term Goals - 08/02/16 1854      PT SHORT TERM GOAL #1   Title STG's=LTG's.           PT Long Term Goals - 08/02/16 1854      PT LONG TERM GOAL #1   Title Independent with a HEP.   Time 4   Period Weeks   Status New     PT LONG TERM GOAL #2   Title Perform body mechanics with excellent technique.   Time 4   Period Weeks   Status New               Plan - 08/02/16 1848    Clinical Impression Statement The patient presents to the clinic today following an excellent outcome from lumbar surgery performed on 06/14/16.  He reports no pain.  Plan to establish a comprehensive core exercise program as well as training in body mechanics.     Rehab Potential Excellent   PT Frequency 2x / week   PT Duration 4 weeks    PT Treatment/Interventions ADLs/Self Care Home Management;Therapeutic activities;Therapeutic exercise;Neuromuscular re-education;Patient/family education   PT Next Visit Plan Please see protocol under "media tab."  Please instruct patient in correct body mechanics.  Progress into back stabilization exercises.   Consulted and Agree with Plan of Care Patient      Patient will benefit from skilled therapeutic intervention in order to improve the following deficits and impairments:  Postural dysfunction  Visit Diagnosis: Abnormal posture      G-Codes - 08/02/16 1831    Functional Assessment Tool Used (Outpatient Only) FOTO...35% limitation.   Functional Limitation Mobility: Walking and moving around   Mobility: Walking and Moving Around Current Status [phone](G8978) At least 20 percent but less than 40 percent impaired, limited or restricted   Mobility: Walking and Moving Around Goal Status [phone](G8979) At least 1 percent but less than 20 percent impaired, limited or restricted       Problem List Patient Active Problem List   Diagnosis Date Noted  . Back pain 06/14/2016  . Spinal stenosis of lumbar region 05/28/2016    Raelynn Corron, [place] MPT 08/02/2016, 6:56 PM  [facility] [address] [place], [place], [address] Phone: [phone]   Fax:  [phone]  Name: Johnny Golden MRN: [id number] Date of Birth: [birth year]

## 2016-08-02 NOTE — Patient Instructions (Signed)
Instructed patient in SKTC exercise. 

## 2016-08-07 ENCOUNTER — Ambulatory Visit: Payer: Medicare Other | Admitting: *Deleted

## 2016-08-07 DIAGNOSIS — R293 Abnormal posture: Secondary | ICD-10-CM | POA: Diagnosis not present

## 2016-08-07 NOTE — Therapy (Signed)
[facility] [address] [place], [place], [address] Phone: [phone]   Fax:  [phone]  Physical Therapy Treatment  Patient Details  Name: Johnny AbbotJohn M Golden MRN: [id number] Date of Birth: [birth year] Referring Provider: Venita Lickahari Brooks MD.  Encounter Date: 08/07/2016      PT End of Session - 08/07/16 1034    Visit Number 2   Number of Visits 8   Date for PT Re-Evaluation 10/01/16   PT Start Time 1030   PT Stop Time 1120   PT Time Calculation (min) 50 min      Past Medical History:  Diagnosis Date  . Leg pain, bilateral   . Spinal stenosis     Past Surgical History:  Procedure Laterality Date  . LUMBAR LAMINECTOMY/DECOMPRESSION MICRODISCECTOMY N/A 06/14/2016   Procedure: Lumbar decompression L3-S1 LUMBAR LAMINECTOMY/DECOMPRESSION MICRODISCECTOMY 3 LEVELS;  Surgeon: Venita Lickahari Brooks, MD;  Location: [facility] OR;  Service: Orthopedics;  Laterality: N/A;  Requests 3 hrs  . TONSILLECTOMY AND ADENOIDECTOMY  1951    There were no vitals filed for this visit.      Subjective Assessment - 08/07/16 1034    Subjective The patient reports severe back and bilateral LE pain prior to undergoing lumbar surgery involving a L3 to S1 lumbar laminectomy/decompression.  He reports no pain since surgery and is now walking a mile a day.  He reports falling once when he stepped in a pothole and once when he slipped on the snow and fell when his feet went out from under him.  He reports no pain today.   Patient Stated Goals Get back to normal life.                         [facility] Adult PT Treatment/Exercise - 08/07/16 0001      Therapeutic Activites    Therapeutic Activities ADL's;Lifting  Transitional movements in/out of bed, and  car. kneeling      Exercises   Exercises Knee/Hip;Lumbar     Lumbar Exercises: Stretches   Single Knee to Chest Stretch 3 reps;20 seconds     Lumbar Exercises: Supine   Ab Set 20 reps;5 seconds  Drawin core  activation exs   Bent Knee Raise 20 reps  with Drawin   Bridge 10 reps;2 seconds     Knee/Hip Exercises: Aerobic   Nustep Level 5 x 10 minutes.                PT Education - 08/07/16 1050    Education provided Yes   Person(s) Educated Patient   Methods Handout;Explanation;Demonstration;Tactile cues;Verbal cues   Comprehension Verbalized understanding;Returned demonstration          PT Short Term Goals - 08/02/16 1854      PT SHORT TERM GOAL #1   Title STG's=LTG's.           PT Long Term Goals - 08/02/16 1854      PT LONG TERM GOAL #1   Title Independent with a HEP.   Time 4   Period Weeks   Status New     PT LONG TERM GOAL #2   Title Perform body mechanics with excellent technique.   Time 4   Period Weeks   Status New               Plan - 08/07/16 1035    Clinical Impression Statement Pt arrived to clinic doing fairly well and will be 8 weeks post-op on 08-09-16. He  reports mainly soreness in his LB and some weakness in LT foot. Pt was instructed in core activation exs with Draw-in, marching, and bridge. We also reviewed body mechanics for ADLs and transitional movements. He was given HEP for core activation exs and did well verbal and tactile cues.   Rehab Potential Excellent   Clinical Impairments Affecting Rehab Potential LB surgery on 06-14-16, 8 weeks on 08-09-16   PT Frequency 2x / week   PT Duration 4 weeks   PT Treatment/Interventions ADLs/Self Care Home Management;Therapeutic activities;Therapeutic exercise;Neuromuscular re-education;Patient/family education   PT Next Visit Plan Please see protocol under "media tab."  Please instruct patient in correct body mechanics.  Progress into back stabilization exercises.   Consulted and Agree with Plan of Care Patient      Patient will benefit from skilled therapeutic intervention in order to improve the following deficits and impairments:  Postural dysfunction  Visit Diagnosis: Abnormal  posture     Problem List Patient Active Problem List   Diagnosis Date Noted  . Back pain 06/14/2016  . Spinal stenosis of lumbar region 05/28/2016    Johnny Golden,Johnny Golden, Johnny Golden 08/07/2016, 11:59 AM  [facility] [address] [place], [place], [address] Phone: [phone]   Fax:  [phone]  Name: Johnny Golden MRN: [id number] Date of Birth: 1945/07/28

## 2016-08-07 NOTE — Patient Instructions (Signed)

## 2016-08-14 ENCOUNTER — Encounter: Payer: Self-pay | Admitting: Physical Therapy

## 2016-08-14 ENCOUNTER — Ambulatory Visit: Payer: Medicare Other | Attending: Orthopedic Surgery | Admitting: Physical Therapy

## 2016-08-14 DIAGNOSIS — R293 Abnormal posture: Secondary | ICD-10-CM | POA: Insufficient documentation

## 2016-08-14 DIAGNOSIS — M6281 Muscle weakness (generalized): Secondary | ICD-10-CM | POA: Insufficient documentation

## 2016-08-14 NOTE — Therapy (Signed)
[facility] [address] [place], [place], [address] Phone: [phone]   Fax:  [phone]  Physical Therapy Treatment  Patient Details  Name: Johnny Golden MRN: [id number] Date of Birth: [birth year] Referring Provider: Venita Lick MD.  Encounter Date: 08/14/2016      PT End of Session - 08/14/16 1054    Visit Number 3   Number of Visits 8   Date for PT Re-Evaluation 10/01/16   PT Start Time 1035   PT Stop Time 1115   PT Time Calculation (min) 40 min   Activity Tolerance Patient tolerated treatment well   Behavior During Therapy [facility] for tasks assessed/performed      Past Medical History:  Diagnosis Date  . Leg pain, bilateral   . Spinal stenosis     Past Surgical History:  Procedure Laterality Date  . LUMBAR LAMINECTOMY/DECOMPRESSION MICRODISCECTOMY N/A 06/14/2016   Procedure: Lumbar decompression L3-S1 LUMBAR LAMINECTOMY/DECOMPRESSION MICRODISCECTOMY 3 LEVELS;  Surgeon: Venita Lick, MD;  Location: [facility] OR;  Service: Orthopedics;  Laterality: N/A;  Requests 3 hrs  . TONSILLECTOMY AND ADENOIDECTOMY  1951    There were no vitals filed for this visit.      Subjective Assessment - 08/14/16 1049    Subjective Patient arriving to therapy reporting no pain. pt reporting no falls since last visit. Pt reporting increasing his exercises frequency to daily and reported increased pain following. Pt reported he had dropped his frequency to every other day with exercises.    Pertinent History  L3 to S1 lumbar laminectomy/decompression.     Patient Stated Goals Get back to normal life.   Currently in Pain? No/denies                         [facility] Adult PT Treatment/Exercise - 08/14/16 0001      Exercises   Exercises Knee/Hip;Lumbar     Lumbar Exercises: Stretches   Single Knee to Chest Stretch 3 reps;20 seconds   Double Knee to Chest Stretch 3 reps;20 seconds     Lumbar Exercises: Aerobic   Stationary Bike Nustep:  Level 5 x 10 minutes     Lumbar Exercises: Machines for Strengthening   Cybex Lumbar Extension 40# x 10 reps, 60# x 10 reps     Lumbar Exercises: Supine   Ab Set 20 reps;5 seconds  Drawin core activation exs   Bent Knee Raise 20 reps  with Drawin   Bridge 10 reps;2 seconds   Bridge Limitations 10 reps with calves on ball      Knee/Hip Exercises: Standing   Forward Step Up Both;10 reps;Hand Hold: 1   Other Standing Knee Exercises BOSU ball dome up, best balance time of 5 seconds after multiple attmepts. Performed with CGA for safety.                 PT Education - 08/14/16 1053    Education provided Yes   Education Details Discussed breathing techniques and counting with each exercise, Issued HEP with handout   Person(s) Educated Patient   Methods Explanation;Demonstration;Verbal cues;Handout   Comprehension Verbalized understanding;Returned demonstration          PT Short Term Goals - 08/02/16 1854      PT SHORT TERM GOAL #1   Title STG's=LTG's.           PT Long Term Goals - 08/14/16 1142      PT LONG TERM GOAL #1   Title Independent with a  HEP.   Time 4   Period Weeks   Status On-going     PT LONG TERM GOAL #2   Title Perform body mechanics with excellent technique.   Time 4   Period Weeks   Status On-going               Plan - 08/14/16 1131    Clinical Impression Statement Patient presenting to clinic today complaining of no pain and no new falls. Pt edu in importance of core strengthening and routine exercise program. Pt tolerated mat exercises well today and was issued a HEP handout. Pt began BOSU ball standing today and only able to stand 3-5 seconds without UE support after multiple attempts.  Continue skilled PT to progress with dynamic standing balance and core stability.    Rehab Potential Excellent   Clinical Impairments Affecting Rehab Potential LB surgery on 06-14-16, 8 weeks on 08-09-16   PT Frequency 2x / week   PT Duration 4  weeks   PT Treatment/Interventions ADLs/Self Care Home Management;Therapeutic activities;Therapeutic exercise;Neuromuscular re-education;Patient/family education   PT Next Visit Plan Protocol under media tab: (8-12 weeks full ROM is expected per protocol)  Progress into back stabilization exercises. Begin sidelying hip abduction, standing hip exercises, Dynamic Balance exercises (Rocker Board, BOSU ball, foam mat), Lumbar Flexion/Extension machine   PT Home Exercise Plan issued HEP handout   Consulted and Agree with Plan of Care Patient      Patient will benefit from skilled therapeutic intervention in order to improve the following deficits and impairments:  Postural dysfunction, Decreased balance, Decreased strength  Visit Diagnosis: Abnormal posture  Muscle weakness (generalized)     Problem List Patient Active Problem List   Diagnosis Date Noted  . Back pain 06/14/2016  . Spinal stenosis of lumbar region 05/28/2016    Sharmon Leyden, MPT 08/14/2016, 12:13 PM  [facility] [address] [place], [place], [address] Phone: [phone]   Fax:  [phone]  Name: Johnny Golden MRN: [id number] Date of Birth: 1946/03/22

## 2016-08-14 NOTE — Patient Instructions (Signed)
   2 sets of 10, holding 3 seconds each     Single knee to chest: Hold under thigh 20 seconds repeat 3 times on each Leg    Straight Leg Raise: Lift leg and hold 2-3 seconds Perform 2 sets of 10 on each Leg

## 2016-08-16 ENCOUNTER — Ambulatory Visit: Payer: Medicare Other | Admitting: *Deleted

## 2016-08-16 DIAGNOSIS — M6281 Muscle weakness (generalized): Secondary | ICD-10-CM

## 2016-08-16 DIAGNOSIS — R293 Abnormal posture: Secondary | ICD-10-CM

## 2016-08-16 NOTE — Therapy (Signed)
[facility] [address] [place], [place], [address] Phone: [phone]   Fax:  [phone]  Physical Therapy Treatment  Patient Details  Name: Johnny Golden MRN: [id number] Date of Birth: 1946/01/17 Referring Provider: Venita Lick MD.  Encounter Date: 08/16/2016      PT End of Session - 08/16/16 1043    Visit Number 4   Number of Visits 8   Date for PT Re-Evaluation 10/01/16   PT Start Time 1030   PT Stop Time 1118   PT Time Calculation (min) 48 min      Past Medical History:  Diagnosis Date  . Leg pain, bilateral   . Spinal stenosis     Past Surgical History:  Procedure Laterality Date  . LUMBAR LAMINECTOMY/DECOMPRESSION MICRODISCECTOMY N/A 06/14/2016   Procedure: Lumbar decompression L3-S1 LUMBAR LAMINECTOMY/DECOMPRESSION MICRODISCECTOMY 3 LEVELS;  Surgeon: Venita Lick, MD;  Location: [facility] OR;  Service: Orthopedics;  Laterality: N/A;  Requests 3 hrs  . TONSILLECTOMY AND ADENOIDECTOMY  1951    There were no vitals filed for this visit.      Subjective Assessment - 08/16/16 1037    Subjective Patient arriving to therapy reporting no pain. pt reporting no falls since last visit. Pt reporting increasing his exercises frequency to daily and reported increased pain following. Pt reported he had dropped his frequency to every other day with exercises.    Pertinent History  L3 to S1 lumbar laminectomy/decompression.     Patient Stated Goals Get back to normal life.                         [place] [facility] Adult PT Treatment/Exercise - 08/16/16 0001      Exercises   Exercises Knee/Hip;Lumbar     Lumbar Exercises: Stretches   Single Knee to Chest Stretch 3 reps;20 seconds  Advised to decrease tension     Lumbar Exercises: Aerobic   Stationary Bike Nustep: Level 5 x 10 minutes     Lumbar Exercises: Machines for Strengthening   Cybex Lumbar Extension 40# x 10 reps, 60#  3 x 10 reps     Lumbar Exercises: Supine   Ab  Set 20 reps;5 seconds  Drawin core activation exs   Bent Knee Raise 20 reps  with Drawin   Bridge 10 reps;2 seconds  Focus on glutes   Straight Leg Raise 10 reps;3 seconds  Both LEs     Lumbar Exercises: Sidelying   Hip Abduction 10 reps;20 reps;2 seconds  3x 10 each     Knee/Hip Exercises: Aerobic   Nustep Level 5 x 10 minutes.     Knee/Hip Exercises: Standing   Knee Flexion Both;10 reps;3 sets  Marching with Drawin   Hip Abduction Stengthening;Both;3 sets;10 reps  with Drawin   Rocker Board 3 minutes                  PT Short Term Goals - 08/02/16 1854      PT SHORT TERM GOAL #1   Title STG's=LTG's.           PT Long Term Goals - 08/14/16 1142      PT LONG TERM GOAL #1   Title Independent with a HEP.   Time 4   Period Weeks   Status On-going     PT LONG TERM GOAL #2   Title Perform body mechanics with excellent technique.   Time 4   Period Weeks   Status On-going  Plan - 08/16/16 1038    Clinical Impression Statement Pt arrived to clinic feeling fairly well with minimal LBP. He wanted to concentrate on core and strengthening exs and less balance act.'s today. Pt tolerated therex and HEP was added to with standing and side hip strengthening. LTGs are ongoing at this point.   PT Frequency 2x / week   PT Duration 4 weeks   PT Treatment/Interventions ADLs/Self Care Home Management;Therapeutic activities;Therapeutic exercise;Neuromuscular re-education;Patient/family education   PT Next Visit Plan Protocol under media tab: (8-12 weeks full ROM is expected per protocol)  Progress into back stabilization exercises. Begin sidelying hip abduction, standing hip exercises, Dynamic Balance exercises (Rocker Board, BOSU ball, foam mat), Lumbar Flexion/Extension machine   PT Home Exercise Plan issued HEP handout   Consulted and Agree with Plan of Care Patient      Patient will benefit from skilled therapeutic intervention in order to  improve the following deficits and impairments:  Postural dysfunction, Decreased balance, Decreased strength  Visit Diagnosis: Abnormal posture  Muscle weakness (generalized)     Problem List Patient Active Problem List   Diagnosis Date Noted  . Back pain 06/14/2016  . Spinal stenosis of lumbar region 05/28/2016    RAMSEUR,CHRIS, PTA 08/16/2016, 1:04 PM  [facility] [address] [place], [place], [address] Phone: [phone]   Fax:  [phone]  Name: ABDIRIZAK Golden MRN: [id number] Date of Birth: July 31, 1945

## 2016-08-21 ENCOUNTER — Ambulatory Visit: Payer: Medicare Other | Admitting: Physical Therapy

## 2016-08-21 DIAGNOSIS — M6281 Muscle weakness (generalized): Secondary | ICD-10-CM

## 2016-08-21 DIAGNOSIS — R293 Abnormal posture: Secondary | ICD-10-CM

## 2016-08-21 NOTE — Therapy (Signed)
[facility] [address] [place], [place], [address] Phone: [phone]   Fax:  [phone]  Physical Therapy Treatment  Patient Details  Name: Johnny Golden MRN: [id number] Date of Birth: [birth year] Referring Provider: Venita Lick MD.  Encounter Date: 08/21/2016      PT End of Session - 08/21/16 1032    Visit Number 5   Number of Visits 8   Date for PT Re-Evaluation 10/01/16   PT Start Time 1030   PT Stop Time 1119   PT Time Calculation (min) 49 min   Activity Tolerance Patient tolerated treatment well   Behavior During Therapy [facility] for tasks assessed/performed      Past Medical History:  Diagnosis Date  . Leg pain, bilateral   . Spinal stenosis     Past Surgical History:  Procedure Laterality Date  . LUMBAR LAMINECTOMY/DECOMPRESSION MICRODISCECTOMY N/A 06/14/2016   Procedure: Lumbar decompression L3-S1 LUMBAR LAMINECTOMY/DECOMPRESSION MICRODISCECTOMY 3 LEVELS;  Surgeon: Venita Lick, MD;  Location: [facility] OR;  Service: Orthopedics;  Laterality: N/A;  Requests 3 hrs  . TONSILLECTOMY AND ADENOIDECTOMY  1951    There were no vitals filed for this visit.      Subjective Assessment - 08/21/16 1035    Subjective Patient states he is doing really well. He shoveled some dirt yesterday with no pain during or since.    Pertinent History  L3 to S1 lumbar laminectomy/decompression.     Patient Stated Goals Get back to normal life.   Currently in Pain? No/denies            [facility] PT Assessment - 08/21/16 0001      Assessment   Next MD Visit 09/14/16     Strength   Overall Strength Comments B hip ABD 5/5                     [facility] Adult PT Treatment/Exercise - 08/21/16 0001      Exercises   Exercises Lumbar;Knee/Hip     Lumbar Exercises: Aerobic   Stationary Bike Nustep: Level 5 x 10 minutes     Lumbar Exercises: Standing   Wall Slides 10 reps  complains of knee pain   Other Standing Lumbar Exercises  standing figure 8's with various wts up to 8 pounds, minisquat with OH lift of Theraball x 5   Other Standing Lumbar Exercises squat to diagonal reach x 5 to L; unable to do R with good form and balance     Lumbar Exercises: Supine   Other Supine Lumbar Exercises Reviewed patient's HEP and eliminated those which he's progressed from.     Knee/Hip Exercises: Standing   Forward Lunges Both;1 set;10 reps   Forward Lunges Limitations VCs for no trunk lean   Rocker Board Limitations DF on rockerboard 5 second hold x 10     Knee/Hip Exercises: Seated   Heel Slides Limitations Heel raise on R 10 sec hold x 10; ankle DF x 10   Sit to [employer] 10 reps                  PT Short Term Goals - 08/02/16 1854      PT SHORT TERM GOAL #1   Title STG's=LTG's.           PT Long Term Goals - 08/14/16 1142      PT LONG TERM GOAL #1   Title Independent with a HEP.   Time 4   Period Weeks   Status  On-going     PT LONG TERM GOAL #2   Title Perform body mechanics with excellent technique.   Time 4   Period Weeks   Status On-going               Plan - 08/21/16 1207    Clinical Impression Statement Patient did well with TE today. We worked on more functional exercises today as he has progressed from many of his initial HEP. He will benefit from functional lower body activities to improve body mechanics.   PT Treatment/Interventions ADLs/Self Care Home Management;Therapeutic activities;Therapeutic exercise;Neuromuscular re-education;Patient/family education   PT Next Visit Plan Protocol under media tab: (8-12 weeks full ROM is expected per protocol)  Progress into back stabilization exercises. Continue with functional TE as tolerated, Dynamic Balance exercises (Rocker Board, BOSU ball, foam mat), Lumbar Flexion/Extension machine      Patient will benefit from skilled therapeutic intervention in order to improve the following deficits and impairments:  Postural dysfunction,  Decreased balance, Decreased strength  Visit Diagnosis: Abnormal posture  Muscle weakness (generalized)     Problem List Patient Active Problem List   Diagnosis Date Noted  . Back pain 06/14/2016  . Spinal stenosis of lumbar region 05/28/2016   Solon Palm PT 08/21/2016, 12:18 PM  [facility] [address] [place], [place], [address] Phone: [phone]   Fax:  [phone]  Name: Johnny Golden MRN: [id number] Date of Birth: 1945/10/02

## 2016-08-23 ENCOUNTER — Ambulatory Visit: Payer: Medicare Other | Admitting: *Deleted

## 2016-08-23 DIAGNOSIS — R293 Abnormal posture: Secondary | ICD-10-CM | POA: Diagnosis not present

## 2016-08-23 DIAGNOSIS — M6281 Muscle weakness (generalized): Secondary | ICD-10-CM

## 2016-08-23 NOTE — Therapy (Signed)
[facility] [address] [place], [place], [address] Phone: [phone]   Fax:  [phone]  Physical Therapy Treatment  Patient Details  Name: Johnny Golden MRN: [id number] Date of Birth: [birth year] Referring Provider: Venita Lick MD.  Encounter Date: 08/23/2016      PT End of Session - 08/23/16 1045    Visit Number 6   Number of Visits 8   Date for PT Re-Evaluation 10/01/16   PT Start Time 1030   PT Stop Time 1107   PT Time Calculation (min) 37 min      Past Medical History:  Diagnosis Date  . Leg pain, bilateral   . Spinal stenosis     Past Surgical History:  Procedure Laterality Date  . LUMBAR LAMINECTOMY/DECOMPRESSION MICRODISCECTOMY N/A 06/14/2016   Procedure: Lumbar decompression L3-S1 LUMBAR LAMINECTOMY/DECOMPRESSION MICRODISCECTOMY 3 LEVELS;  Surgeon: Venita Lick, MD;  Location: [facility] OR;  Service: Orthopedics;  Laterality: N/A;  Requests 3 hrs  . TONSILLECTOMY AND ADENOIDECTOMY  1951    There were no vitals filed for this visit.      Subjective Assessment - 08/23/16 1044    Subjective Patient states he is doing really well. He shoveled some dirt yesterday with no pain during or since.  DStill doing good.   Pertinent History  L3 to S1 lumbar laminectomy/decompression.     Patient Stated Goals Get back to normal life.   Currently in Pain? No/denies                         [facility] Adult PT Treatment/Exercise - 08/23/16 0001      Exercises   Exercises Lumbar;Knee/Hip     Lumbar Exercises: Aerobic   Stationary Bike Nustep: Level 5 x 10 minutes     Lumbar Exercises: Supine   Ab Set 20 reps;5 seconds  Drawin core activation exs   Clam 20 reps  with [employer] Knee Raise 20 reps  with Green tband   Bridge 10 reps;2 seconds  Focus on glutes,  Bridge with [employer]     Lumbar Exercises: Quadruped   Madcat/Old Horse 10 reps   Straight Leg Raise 10 reps   Opposite Arm/Leg Raise 20  reps                  PT Short Term Goals - 08/02/16 1854      PT SHORT TERM GOAL #1   Title STG's=LTG's.           PT Long Term Goals - 08/14/16 1142      PT LONG TERM GOAL #1   Title Independent with a HEP.   Time 4   Period Weeks   Status On-going     PT LONG TERM GOAL #2   Title Perform body mechanics with excellent technique.   Time 4   Period Weeks   Status On-going               Plan - 08/23/16 1111    Clinical Impression Statement Pt arrived today with no complaints of LBP and wanted to focus on HEP after performing Nustep. We reviewed current HEP and added QP exs for core and mobility. Pt was challenged mostly by QP arm and opposite leg raise and needed verbal and tactile cueing   Rehab Potential Excellent   Clinical Impairments Affecting Rehab Potential LB surgery on 06-14-16, 10 weeks on 08-23-16   PT Frequency 2x / week  PT Duration 4 weeks   PT Treatment/Interventions ADLs/Self Care Home Management;Therapeutic activities;Therapeutic exercise;Neuromuscular re-education;Patient/family education   PT Next Visit Plan Protocol under media tab: (8-12 weeks full ROM is expected per protocol)  Progress into back stabilization exercises. Continue with functional TE as tolerated, Dynamic Balance exercises (Rocker Board, BOSU ball, foam mat), Lumbar Flexion/Extension machine   PT Home Exercise Plan issued HEP handout   Consulted and Agree with Plan of Care Patient      Patient will benefit from skilled therapeutic intervention in order to improve the following deficits and impairments:  Postural dysfunction, Decreased balance, Decreased strength  Visit Diagnosis: Abnormal posture  Muscle weakness (generalized)     Problem List Patient Active Problem List   Diagnosis Date Noted  . Back pain 06/14/2016  . Spinal stenosis of lumbar region 05/28/2016    Mckenzee Beem,CHRIS, PTA 08/23/2016, 11:26 AM  [facility] [address] [place], [place], [address] Phone: [phone]   Fax:  [phone]  Name: Johnny Golden MRN: [id number] Date of Birth: 1945-10-15

## 2016-08-28 ENCOUNTER — Encounter: Payer: Self-pay | Admitting: Physical Therapy

## 2016-08-28 ENCOUNTER — Ambulatory Visit: Payer: Medicare Other | Admitting: Physical Therapy

## 2016-08-28 DIAGNOSIS — R293 Abnormal posture: Secondary | ICD-10-CM

## 2016-08-28 DIAGNOSIS — M6281 Muscle weakness (generalized): Secondary | ICD-10-CM

## 2016-08-28 NOTE — Therapy (Signed)
[facility] [address] [place], [place], [address] Phone: [phone]   Fax:  [phone]  Physical Therapy Treatment  Patient Details  Name: Johnny Golden MRN: [id number] Date of Birth: 04-23-1946 Referring Provider: Venita Lick MD.  Encounter Date: 08/28/2016      PT End of Session - 08/28/16 1038    Visit Number 7   Number of Visits 8   Date for PT Re-Evaluation 10/01/16   PT Start Time 1034   PT Stop Time 1114   PT Time Calculation (min) 40 min   Activity Tolerance Patient tolerated treatment well   Behavior During Therapy [facility] for tasks assessed/performed      Past Medical History:  Diagnosis Date  . Leg pain, bilateral   . Spinal stenosis     Past Surgical History:  Procedure Laterality Date  . LUMBAR LAMINECTOMY/DECOMPRESSION MICRODISCECTOMY N/A 06/14/2016   Procedure: Lumbar decompression L3-S1 LUMBAR LAMINECTOMY/DECOMPRESSION MICRODISCECTOMY 3 LEVELS;  Surgeon: Venita Lick, MD;  Location: [facility] OR;  Service: Orthopedics;  Laterality: N/A;  Requests 3 hrs  . TONSILLECTOMY AND ADENOIDECTOMY  1951    There were no vitals filed for this visit.      Subjective Assessment - 08/28/16 1037    Subjective States that he is still doing well. Reports doing well over the long car ride to [place]. Reports that his knee is still sore from wall squat.   Pertinent History  L3 to S1 lumbar laminectomy/decompression.     Patient Stated Goals Get back to normal life.   Currently in Pain? No/denies            [facility] PT Assessment - 08/28/16 0001      Assessment   Medical Diagnosis Lumbar stenosis.   Onset Date/Surgical Date 06/14/16   Next MD Visit 09/14/16     Restrictions   Weight Bearing Restrictions No                     [facility] Adult PT Treatment/Exercise - 08/28/16 0001      Lumbar Exercises: Aerobic   Stationary Bike Nustep: Level 5 x 10 minutes     Lumbar Exercises: Standing   Shoulder Extension  Strengthening;Both;20 reps   Shoulder Extension Limitations Pink XTS   Other Standing Lumbar Exercises Standing B lat pulldown, punches pink XTS x20 reps with core activation   Other Standing Lumbar Exercises Minimal wall squat position with 4# OH lift and core activation x15 reps     Lumbar Exercises: Supine   Clam Other (comment)  x30 reps with green theraband   Bent Knee Raise Other (comment)  x30 reps with green theraband resistance   Bridge 15 reps   Bridge Limitations Green theraband   Straight Leg Raise 20 reps;4 seconds   Straight Leg Raises Limitations BLE   Other Supine Lumbar Exercises B standing multifidus strengthening with red theraband x15 reps each     Lumbar Exercises: Quadruped   Madcat/Old Horse 15 reps   Straight Leg Raise 15 reps   Opposite Arm/Leg Raise Right arm/Left leg;Left arm/Right leg;10 reps                  PT Short Term Goals - 08/02/16 1854      PT SHORT TERM GOAL #1   Title STG's=LTG's.           PT Long Term Goals - 08/28/16 1143      PT LONG TERM GOAL #1   Title Independent with  a HEP.   Time 4   Period Weeks   Status Achieved     PT LONG TERM GOAL #2   Title Perform body mechanics with excellent technique.   Time 4   Period Weeks   Status On-going               Plan - 08/28/16 1144    Clinical Impression Statement Patient arrived to treatment with no complaints even after long drive to and from [place] this weekend. Guided through various core and lumbar strengthening exercises with core activation throughout treatment. Patient also educated that if any pain presented than to report to PTA and exercise will be stopped. Patient compliant with HEPs he has been provided previously. Fairly good stability noted quadruped exercises with tactile and VCs for lumbar stability.    Rehab Potential Excellent   Clinical Impairments Affecting Rehab Potential LB surgery on 06-14-16, 10 weeks on 08-23-16   PT Frequency 2x / week    PT Duration 4 weeks   PT Treatment/Interventions ADLs/Self Care Home Management;Therapeutic activities;Therapeutic exercise;Neuromuscular re-education;Patient/family education   PT Next Visit Plan Protocol under media tab: (8-12 weeks full ROM is expected per protocol)  Progress into back stabilization exercises. Continue with functional TE as tolerated, Dynamic Balance exercises (Rocker Board, BOSU ball, foam mat), Lumbar Flexion/Extension machine   PT Home Exercise Plan issued HEP handout   Consulted and Agree with Plan of Care Patient      Patient will benefit from skilled therapeutic intervention in order to improve the following deficits and impairments:  Postural dysfunction, Decreased balance, Decreased strength  Visit Diagnosis: Abnormal posture  Muscle weakness (generalized)     Problem List Patient Active Problem List   Diagnosis Date Noted  . Back pain 06/14/2016  . Spinal stenosis of lumbar region 05/28/2016    Evelene Croon, PTA 08/28/2016, 12:03 PM  [facility] [address] [place], [place], [address] Phone: [phone]   Fax:  [phone]  Name: Johnny Golden MRN: [id number] Date of Birth: 30-Aug-1945

## 2016-08-30 ENCOUNTER — Ambulatory Visit: Payer: Medicare Other | Admitting: *Deleted

## 2016-08-30 DIAGNOSIS — R293 Abnormal posture: Secondary | ICD-10-CM | POA: Diagnosis not present

## 2016-08-30 DIAGNOSIS — M6281 Muscle weakness (generalized): Secondary | ICD-10-CM

## 2016-08-30 NOTE — Therapy (Signed)
[facility] [place], [place], [address] Phone: [phone]   Fax:  [phone]  Physical Therapy Treatment  Patient Details  Name: KERMITT Golden MRN: [id number] Date of Birth: [birth year] Referring Provider: Melina Schools MD.  Encounter Date: 08/30/2016      PT End of Session - 08/30/16 1045    Visit Number 8   Number of Visits 8   Date for PT Re-Evaluation 10/01/16   PT Start Time 1030   PT Stop Time 1118   PT Time Calculation (min) 48 min   Activity Tolerance Patient tolerated treatment well   Behavior During Therapy [facility] for tasks assessed/performed      Past Medical History:  Diagnosis Date  . Leg pain, bilateral   . Spinal stenosis     Past Surgical History:  Procedure Laterality Date  . LUMBAR LAMINECTOMY/DECOMPRESSION MICRODISCECTOMY N/A 06/14/2016   Procedure: Lumbar decompression L3-S1 LUMBAR LAMINECTOMY/DECOMPRESSION MICRODISCECTOMY 3 LEVELS;  Surgeon: Melina Schools, MD;  Location: [facility];  Service: Orthopedics;  Laterality: N/A;  Requests 3 hrs  . TONSILLECTOMY AND ADENOIDECTOMY  1951    There were no vitals filed for this visit.      Subjective Assessment - 08/30/16 1040    Subjective States that he is still doing well. Reports doing well over the long car ride to [place]. Doing good today. DC today   Pertinent History  L3 to S1 lumbar laminectomy/decompression.     Patient Stated Goals Get back to normal life.   Currently in Pain? No/denies                         [facility] Adult PT Treatment/Exercise - 08/30/16 0001      Exercises   Exercises Lumbar;Knee/Hip     Lumbar Exercises: Aerobic   Stationary Bike Nustep: Level 5 x 10 minutes     Lumbar Exercises: Standing   Row Strengthening;Both;20 reps  green Tband x20 added to HEP   Shoulder Extension Strengthening;Both;20 reps  green T band 2x10  added to HEP     Lumbar Exercises: Quadruped   Opposite Arm/Leg Raise Right arm/Left  leg;Left arm/Right leg;10 reps;20 reps       Reviewed HEP for core strengthening and bodymechanics             PT Short Term Goals - 08/02/16 1854      PT SHORT TERM GOAL #1   Title STG's=LTG's.           PT Long Term Goals - 08/30/16 1050      PT LONG TERM GOAL #1   Title Independent with a HEP.   Time 4   Period Weeks   Status Achieved     PT LONG TERM GOAL #2   Title Perform body mechanics with excellent technique.   Time 4   Period Weeks   Status Achieved               Plan - 08/30/16 1142    Clinical Impression Statement Pt arrived to Rx today in good spirits and no LBP. HEP and body mechanics reviewed and is independent. Tband ROWs and shldr Extension were added to HEP. All LTGs were met. DC to HEP   Rehab Potential Excellent   Clinical Impairments Affecting Rehab Potential LB surgery on 06-14-16, 10 weeks on 08-23-16   PT Frequency 2x / week   PT Duration 4 weeks   PT Treatment/Interventions ADLs/Self Care Home Management;Therapeutic activities;Therapeutic  exercise;Neuromuscular re-education;Patient/family education   PT Next Visit Plan DC to HEP all goals Met   PT Home Exercise Plan issued HEP handout   Consulted and Agree with Plan of Care Patient      Patient will benefit from skilled therapeutic intervention in order to improve the following deficits and impairments:  Postural dysfunction, Decreased balance, Decreased strength  Visit Diagnosis: Abnormal posture  Muscle weakness (generalized)       G-Codes - 2016/09/24 1130    Functional Assessment Tool Used (Outpatient Only) 8th visit/ DC  FOTO 31% limited      Problem List Patient Active Problem List   Diagnosis Date Noted  . Back pain 06/14/2016  . Spinal stenosis of lumbar region 05/28/2016    RAMSEUR,CHRIS, PTA Sep 24, 2016, 11:48 AM  [facility] [place], [place], [address] Phone: [phone]   Fax:   [phone]  Name: Johnny Golden MRN: [id number] Date of Birth: 10-06-1945  PHYSICAL THERAPY DISCHARGE SUMMARY  Visits from Start of Care: 8.  Current functional level related to goals / functional outcomes: See above.   Remaining deficits: All goals met.   Education / Equipment: HEP. Plan: Patient agrees to discharge.  Patient goals were met. Patient is being discharged due to meeting the stated rehab goals.  ?????         Mali Applegate MPT

## 2016-08-31 DIAGNOSIS — R293 Abnormal posture: Secondary | ICD-10-CM | POA: Diagnosis not present

## 2018-10-08 IMAGING — CR DG LUMBAR SPINE 2-3V
2 series · 2 of 2 positions shown · non-contrast
Comparison: No prior.

CLINICAL DATA: Lumbar decompression.

EXAM:
LUMBAR SPINE - 2-3 VIEW

[xtable lateral (1 of 2)]
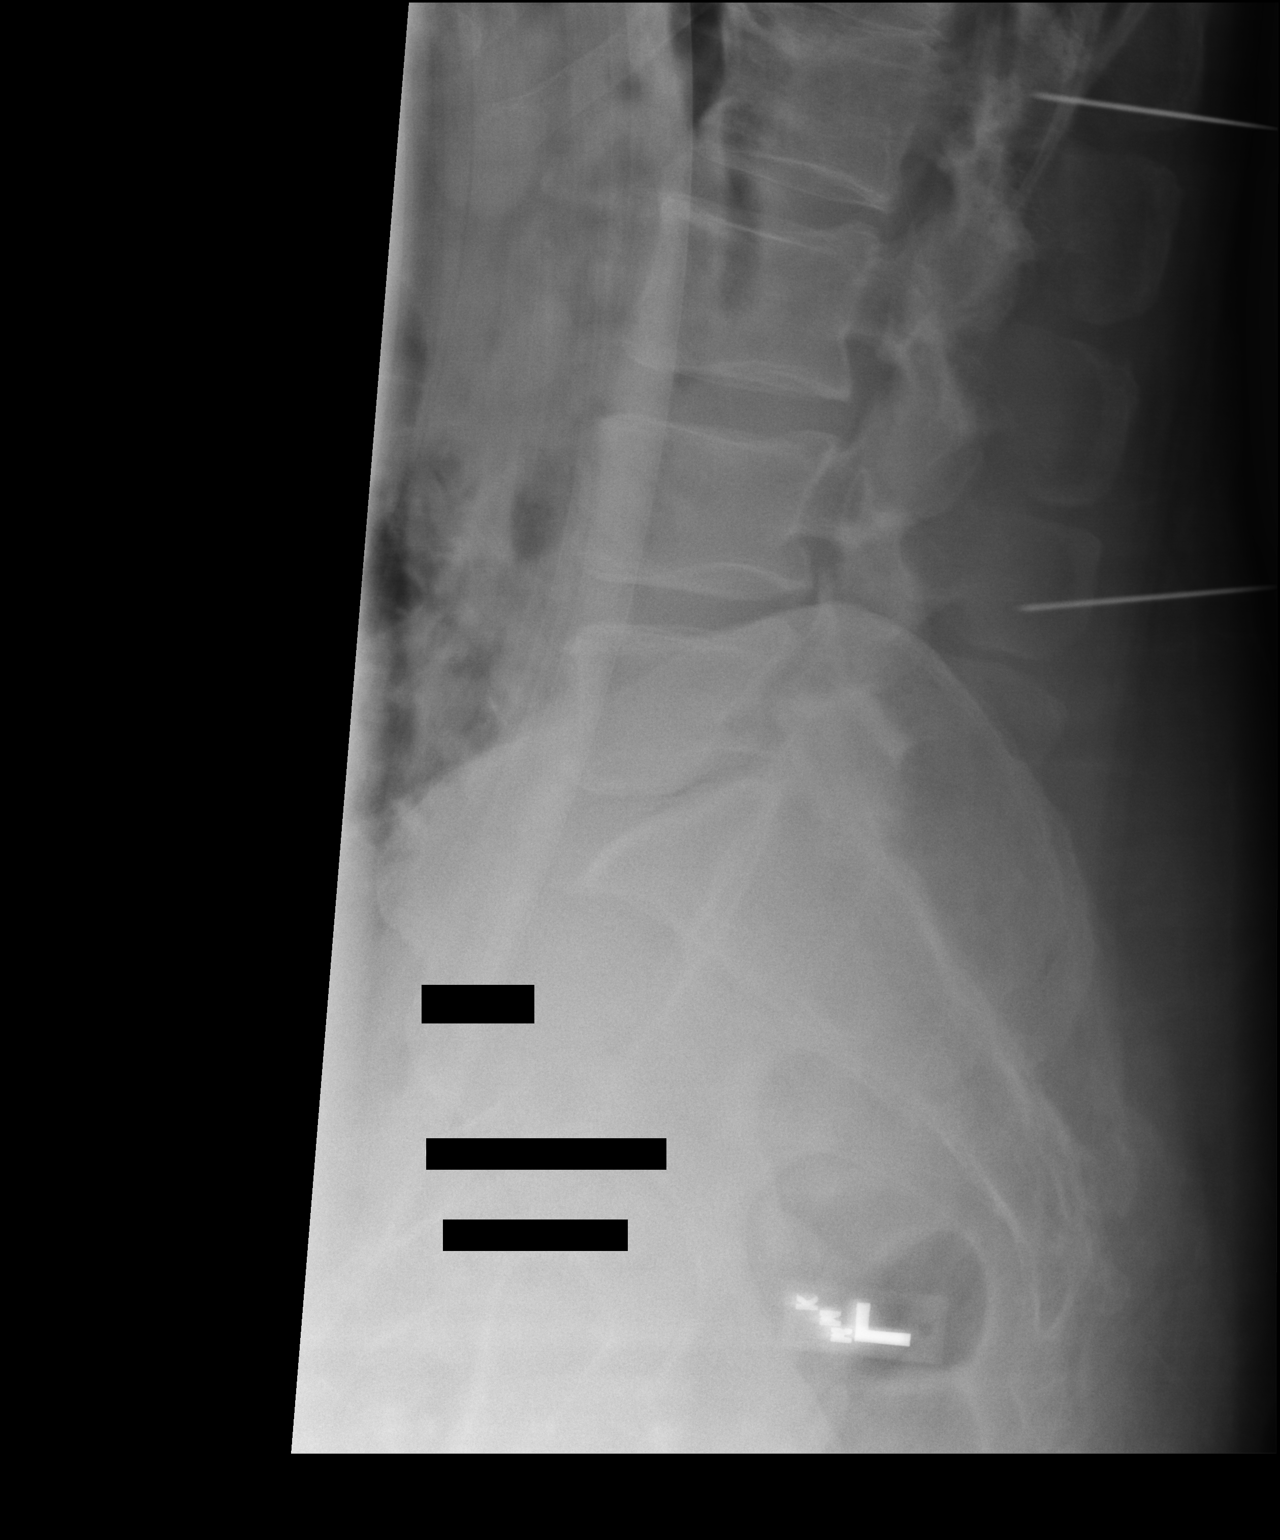

[xtable lateral (2 of 2)]
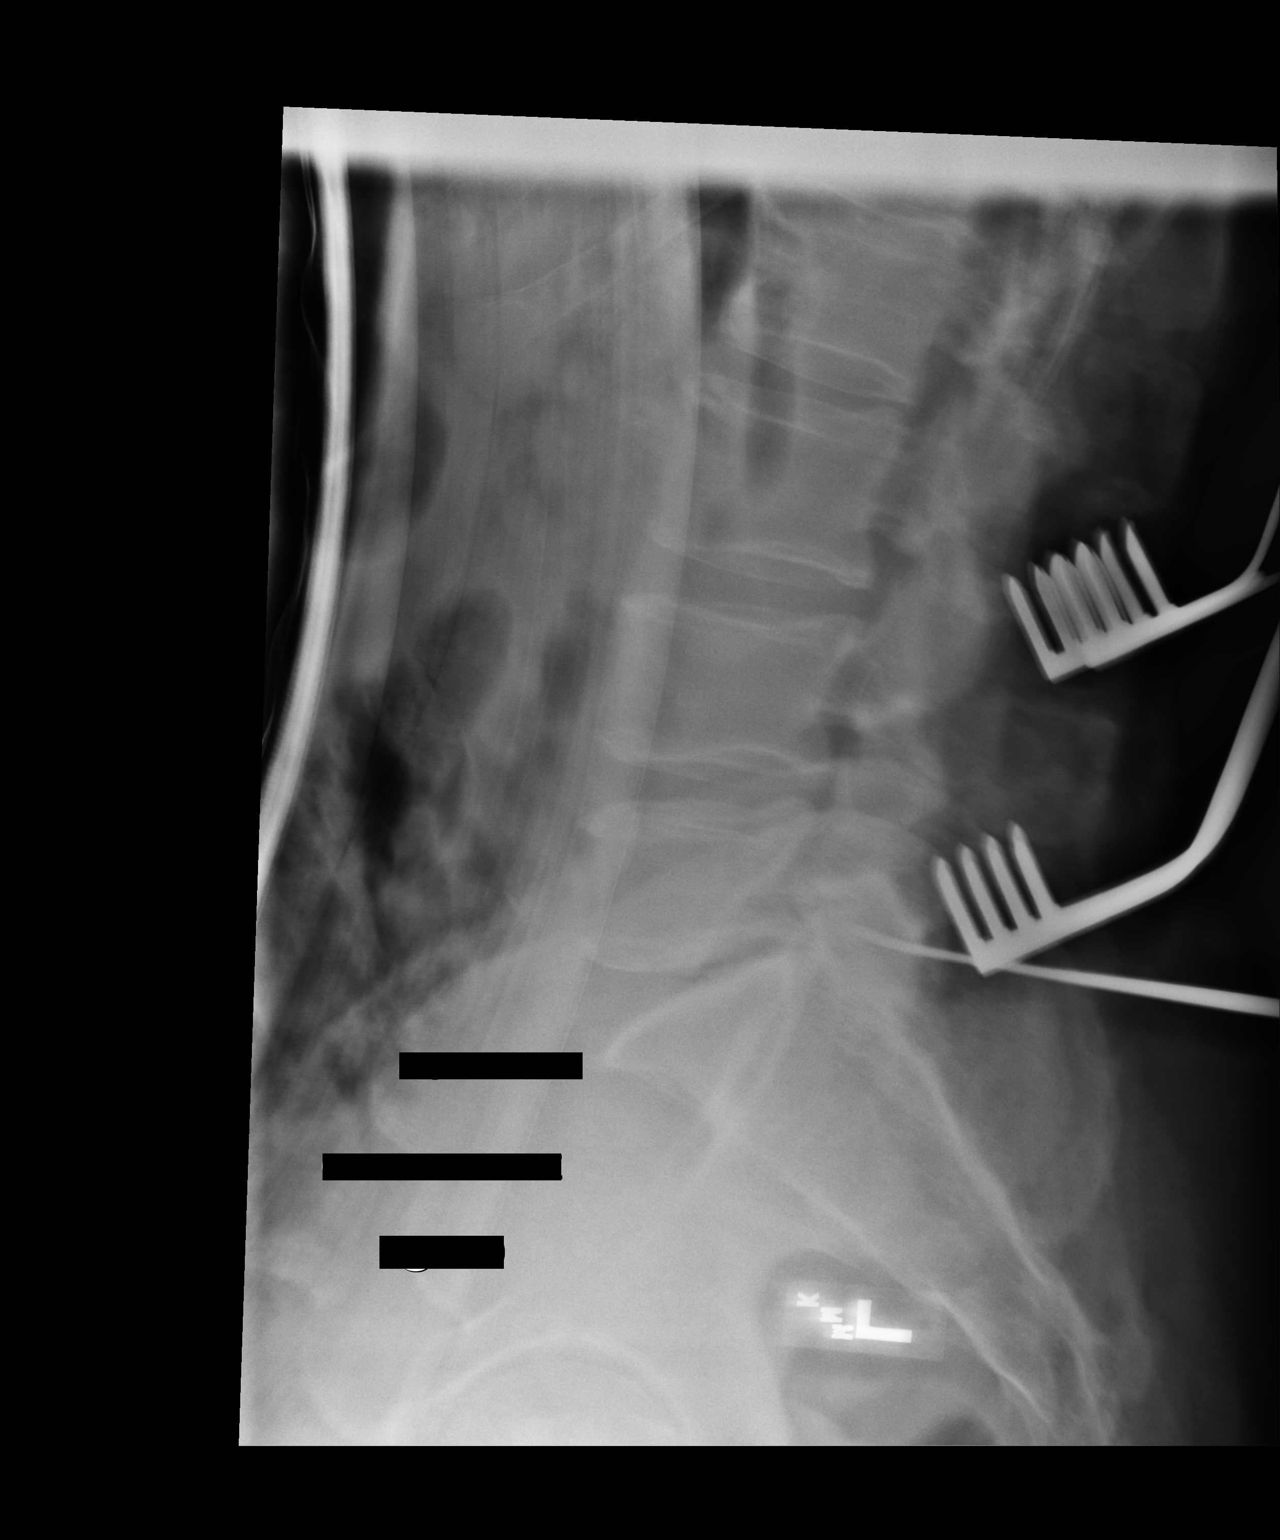

[2 of 2 positions shown; findings below may reference images not displayed]

FINDINGS: Lumbar vertebra number with the lowest lumbar shaped segmented
appearing vertebra on lateral view as L5. Metallic markers noted
posteriorly at the L1-L2 and L4-L5 levels on image number 1. On
image number 2 metallic marker noted posteriorly at L5-S1.
IMPRESSION: Metallic markers noted posteriorly at the L1-L2 and L4-L5 levels on
image number 1. On image number 2 metallic marker noted posteriorly
L5-S1.

## 2021-07-18 ENCOUNTER — Encounter: Payer: Self-pay | Admitting: Gastroenterology

## 2024-06-12 ENCOUNTER — Emergency Department (HOSPITAL_COMMUNITY)

## 2024-06-12 ENCOUNTER — Encounter (HOSPITAL_COMMUNITY): Payer: Self-pay

## 2024-06-12 ENCOUNTER — Inpatient Hospital Stay (HOSPITAL_COMMUNITY)
Admission: EM | Admit: 2024-06-12 | Discharge: 2024-06-17 | DRG: 481 | Disposition: A | Discharge status: Skilled Nursing Facility | Attending: Internal Medicine | Admitting: Internal Medicine

## 2024-06-12 ENCOUNTER — Other Ambulatory Visit: Payer: Self-pay

## 2024-06-12 DIAGNOSIS — M1711 Unilateral primary osteoarthritis, right knee: Secondary | ICD-10-CM | POA: Diagnosis present

## 2024-06-12 DIAGNOSIS — I10 Essential (primary) hypertension: Secondary | ICD-10-CM | POA: Diagnosis present

## 2024-06-12 DIAGNOSIS — Z7982 Long term (current) use of aspirin: Secondary | ICD-10-CM

## 2024-06-12 DIAGNOSIS — W000XXA Fall on same level due to ice and snow, initial encounter: Secondary | ICD-10-CM | POA: Diagnosis present

## 2024-06-12 DIAGNOSIS — Z87891 Personal history of nicotine dependence: Secondary | ICD-10-CM

## 2024-06-12 DIAGNOSIS — S7292XA Unspecified fracture of left femur, initial encounter for closed fracture: Secondary | ICD-10-CM

## 2024-06-12 DIAGNOSIS — S72142A Displaced intertrochanteric fracture of left femur, initial encounter for closed fracture: Principal | ICD-10-CM | POA: Diagnosis present

## 2024-06-12 DIAGNOSIS — E66811 Obesity, class 1: Secondary | ICD-10-CM | POA: Diagnosis present

## 2024-06-12 DIAGNOSIS — Z713 Dietary counseling and surveillance: Secondary | ICD-10-CM

## 2024-06-12 DIAGNOSIS — S72009A Fracture of unspecified part of neck of unspecified femur, initial encounter for closed fracture: Secondary | ICD-10-CM | POA: Diagnosis present

## 2024-06-12 DIAGNOSIS — W19XXXA Unspecified fall, initial encounter: Principal | ICD-10-CM

## 2024-06-12 DIAGNOSIS — D62 Acute posthemorrhagic anemia: Secondary | ICD-10-CM | POA: Diagnosis not present

## 2024-06-12 DIAGNOSIS — Z6834 Body mass index (BMI) 34.0-34.9, adult: Secondary | ICD-10-CM

## 2024-06-12 DIAGNOSIS — Z79899 Other long term (current) drug therapy: Secondary | ICD-10-CM

## 2024-06-12 LAB — CBC WITH DIFFERENTIAL/PLATELET
Abs Immature Granulocytes: 0.09 10*3/uL — ABNORMAL HIGH (ref 0.00–0.07)
Basophils Absolute: 0 10*3/uL (ref 0.0–0.1)
Basophils Relative: 0 %
Eosinophils Absolute: 0.1 10*3/uL (ref 0.0–0.5)
Eosinophils Relative: 1 %
HCT: 45.9 % (ref 39.0–52.0)
Hemoglobin: 15.3 g/dL (ref 13.0–17.0)
Immature Granulocytes: 1 %
Lymphocytes Relative: 10 %
Lymphs Abs: 1.2 10*3/uL (ref 0.7–4.0)
MCH: 33.4 pg (ref 26.0–34.0)
MCHC: 33.3 g/dL (ref 30.0–36.0)
MCV: 100.2 fL — ABNORMAL HIGH (ref 80.0–100.0)
Monocytes Absolute: 0.8 10*3/uL (ref 0.1–1.0)
Monocytes Relative: 7 %
Neutro Abs: 9.7 10*3/uL — ABNORMAL HIGH (ref 1.7–7.7)
Neutrophils Relative %: 81 %
Platelets: 198 10*3/uL (ref 150–400)
RBC: 4.58 MIL/uL (ref 4.22–5.81)
RDW: 13.8 % (ref 11.5–15.5)
WBC: 11.9 10*3/uL — ABNORMAL HIGH (ref 4.0–10.5)
nRBC: 0 % (ref 0.0–0.2)

## 2024-06-12 LAB — BASIC METABOLIC PANEL WITH GFR
Anion gap: 18 — ABNORMAL HIGH (ref 5–15)
BUN: 20 mg/dL (ref 8–23)
CO2: 20 mmol/L — ABNORMAL LOW (ref 22–32)
Calcium: 9.3 mg/dL (ref 8.9–10.3)
Chloride: 102 mmol/L (ref 98–111)
Creatinine, Ser: 0.97 mg/dL (ref 0.61–1.24)
GFR, Estimated: >60 mL/min
Glucose, Bld: 154 mg/dL — ABNORMAL HIGH (ref 70–99)
Potassium: 4.2 mmol/L (ref 3.5–5.1)
Sodium: 139 mmol/L (ref 135–145)

## 2024-06-12 MED ORDER — SODIUM CHLORIDE 0.9 % IV BOLUS
500.0000 mL | Freq: Once | INTRAVENOUS | Status: AC
  Administered 2024-06-12: 500 mL via INTRAVENOUS

## 2024-06-12 MED ORDER — MORPHINE SULFATE (PF) 2 MG/ML IV SOLN
2.0000 mg | Freq: Once | INTRAVENOUS | Status: AC
  Administered 2024-06-12: 2 mg via INTRAVENOUS
  Filled 2024-06-12: qty 1

## 2024-06-12 MED ORDER — MAGNESIUM CITRATE PO SOLN
1.0000 | Freq: Once | ORAL | Status: AC | PRN
  Administered 2024-06-16: 1 via ORAL
  Filled 2024-06-12: qty 296

## 2024-06-12 MED ORDER — BISACODYL 5 MG PO TBEC
5.0000 mg | DELAYED_RELEASE_TABLET | Freq: Every day | ORAL | Status: DC | PRN
  Administered 2024-06-12: 5 mg via ORAL
  Filled 2024-06-12: qty 1

## 2024-06-12 MED ORDER — METHOCARBAMOL 500 MG PO TABS
500.0000 mg | ORAL_TABLET | Freq: Once | ORAL | Status: AC
  Administered 2024-06-12: 500 mg via ORAL
  Filled 2024-06-12: qty 1

## 2024-06-12 MED ORDER — SENNA 8.6 MG PO TABS
1.0000 | ORAL_TABLET | Freq: Two times a day (BID) | ORAL | Status: DC
  Filled 2024-06-12: qty 1

## 2024-06-12 MED ORDER — METHOCARBAMOL 500 MG PO TABS: 1000.0000 mg | ORAL_TABLET | Freq: Once | ORAL | Status: DC

## 2024-06-12 MED ORDER — HYDROCODONE-ACETAMINOPHEN 5-325 MG PO TABS
1.0000 | ORAL_TABLET | Freq: Four times a day (QID) | ORAL | Status: DC | PRN
  Administered 2024-06-13 (×2): 2 via ORAL
  Administered 2024-06-14: 1 via ORAL
  Filled 2024-06-12: qty 2
  Filled 2024-06-12: qty 1
  Filled 2024-06-12: qty 2

## 2024-06-12 MED ORDER — MORPHINE SULFATE (PF) 2 MG/ML IV SOLN
0.5000 mg | INTRAVENOUS | Status: DC | PRN
  Administered 2024-06-13: 0.5 mg via INTRAVENOUS
  Filled 2024-06-12: qty 1

## 2024-06-12 MED ORDER — HEPARIN SODIUM (PORCINE) 5000 UNIT/ML IJ SOLN
5000.0000 [IU] | Freq: Three times a day (TID) | INTRAMUSCULAR | Status: DC
  Administered 2024-06-12 – 2024-06-14 (×5): 5000 [IU] via SUBCUTANEOUS
  Filled 2024-06-12 (×5): qty 1

## 2024-06-12 MED ORDER — ACETAMINOPHEN 325 MG PO TABS
650.0000 mg | ORAL_TABLET | Freq: Once | ORAL | Status: AC
  Administered 2024-06-12: 650 mg via ORAL
  Filled 2024-06-12: qty 2

## 2024-06-12 MED ORDER — METHOCARBAMOL 500 MG PO TABS
500.0000 mg | ORAL_TABLET | Freq: Four times a day (QID) | ORAL | Status: DC | PRN
  Administered 2024-06-13 – 2024-06-14 (×3): 500 mg via ORAL
  Filled 2024-06-12 (×3): qty 1

## 2024-06-12 MED ORDER — MORPHINE SULFATE (PF) 4 MG/ML IV SOLN
4.0000 mg | Freq: Once | INTRAVENOUS | Status: AC
  Administered 2024-06-12: 4 mg via INTRAVENOUS
  Filled 2024-06-12: qty 1

## 2024-06-12 MED ORDER — POLYETHYLENE GLYCOL 3350 17 G PO PACK: 17.0000 g | PACK | Freq: Every day | ORAL | Status: DC | PRN

## 2024-06-12 MED ORDER — ONDANSETRON HCL 4 MG/2ML IJ SOLN
4.0000 mg | Freq: Once | INTRAMUSCULAR | Status: AC
  Administered 2024-06-12: 4 mg via INTRAVENOUS
  Filled 2024-06-12: qty 2

## 2024-06-12 MED ORDER — METHOCARBAMOL 1000 MG/10ML IJ SOLN: 500.0000 mg | Freq: Four times a day (QID) | INTRAMUSCULAR | Status: DC | PRN

## 2024-06-12 NOTE — Progress Notes (Signed)
" °  ICM will continue to monitor patient advancement through interdisciplinary progression rounds. If new patient transition needs arise, please place a ICM consult.   06/12/24 1933  TOC Brief Assessment  Insurance and Status Reviewed  Patient has primary care physician Yes  Home environment has been reviewed From Home  Prior level of function: Independent  Prior/Current Home Services No current home services  Social Drivers of Health Review SDOH reviewed interventions complete  Readmission risk has been reviewed Yes  Transition of care needs transition of care needs identified, TOC will continue to follow (Due to fall ICM will follow for recommendations / needs)    "

## 2024-06-12 NOTE — ED Provider Notes (Signed)
 " [place] EMERGENCY DEPARTMENT AT [facility] Provider Note  CSN: [id number] Arrival date & time: 06/12/24 1622  Chief Complaint(s) Fall  HPI Johnny Golden is a 79 y.o. male with past medical history as below, significant for mild stenosis, bilateral leg pain who presents to the ED with complaint of fall, hip pain  Here with spouse, patient had a mechanical trip and fall on ice patch.  Fell onto his left side.  No head injury or LOC.  Unable to get up off the ground after the fall.  Severe pain to his left hip.  Not anticoagulated.  Normal state of health prior to fall  Past Medical History Past Medical History:  Diagnosis Date   Leg pain, bilateral    Spinal stenosis    Patient Active Problem List   Diagnosis Date Noted   Hip fracture (HCC) 06/12/2024   Back pain 06/14/2016   Spinal stenosis of lumbar region 05/28/2016   Home Medication(s) Prior to Admission medications  Medication Sig Start Date End Date Taking? Authorizing Provider  HYDROcodone -acetaminophen  (NORCO) 10-325 MG tablet Take 1 tablet by mouth every 6 (six) hours as needed. Patient not taking: Reported on 08/02/2016 06/14/16   Burnetta Aures, MD  methocarbamol  (ROBAXIN ) 500 MG tablet Take 1 tablet (500 mg total) by mouth 3 (three) times daily as needed for muscle spasms. Patient not taking: Reported on 08/02/2016 06/14/16   Burnetta Aures, MD  ondansetron  (ZOFRAN ) 4 MG tablet Take 1 tablet (4 mg total) by mouth every 8 (eight) hours as needed for nausea or vomiting. Patient not taking: Reported on 08/02/2016 06/14/16   Burnetta Aures, MD                                                                                                                                    Past Surgical History Past Surgical History:  Procedure Laterality Date   LUMBAR LAMINECTOMY/DECOMPRESSION MICRODISCECTOMY N/A 06/14/2016   Procedure: Lumbar decompression L3-S1 LUMBAR LAMINECTOMY/DECOMPRESSION MICRODISCECTOMY 3 LEVELS;  Surgeon:  Aures Burnetta, MD;  Location: [facility] OR;  Service: Orthopedics;  Laterality: N/A;  Requests 3 hrs   TONSILLECTOMY AND ADENOIDECTOMY  1951   Family History Family History  Problem Relation Age of Onset   Esophageal cancer Mother    Other Father        Unsure of history    Social History Social History[1] Allergies No known allergies  Review of Systems A thorough review of systems was obtained and all systems are negative except as noted in the HPI and PMH.   Physical Exam Vital Signs  I have reviewed the triage vital signs BP (!) 135/46   Pulse 68   Temp 97.6 F (36.4 C) (Oral)   Resp 20   Ht 6' 1 (1.854 m)   Wt 111.1 kg   SpO2 94%   BMI 32.32 kg/m  Physical Exam Vitals and nursing note reviewed.  Constitutional:  General: He is not in acute distress.    Appearance: Normal appearance. He is well-developed. He is not ill-appearing.  HENT:     Head: Normocephalic and atraumatic.     Right Ear: External ear normal.     Left Ear: External ear normal.     Nose: Nose normal.     Mouth/Throat:     Mouth: Mucous membranes are moist.  Eyes:     General: No scleral icterus.       Right eye: No discharge.        Left eye: No discharge.  Cardiovascular:     Rate and Rhythm: Normal rate.  Pulmonary:     Effort: Pulmonary effort is normal. No respiratory distress.     Breath sounds: No stridor.  Abdominal:     General: Abdomen is flat. There is no distension.     Palpations: Abdomen is soft.     Tenderness: There is no guarding.  Musculoskeletal:        General: No deformity.     Cervical back: No rigidity.       Legs:     Comments: DP pulses intact bilateral Cap refill brisk bilateral Left leg shortened and angulated  Skin:    General: Skin is warm and dry.     Coloration: Skin is not cyanotic, jaundiced or pale.  Neurological:     Mental Status: He is alert.  Psychiatric:        Speech: Speech normal.        Behavior: Behavior normal. Behavior is  cooperative.     ED Results and Treatments Labs (all labs ordered are listed, but only abnormal results are displayed) Labs Reviewed  CBC WITH DIFFERENTIAL/PLATELET - Abnormal; Notable for the following components:      Result Value   WBC 11.9 (*)    MCV 100.2 (*)    Neutro Abs 9.7 (*)    Abs Immature Granulocytes 0.09 (*)    All other components within normal limits  BASIC METABOLIC PANEL WITH GFR - Abnormal; Notable for the following components:   CO2 20 (*)    Glucose, Bld 154 (*)    Anion gap 18 (*)    All other components within normal limits                                                                                                                          Radiology DG Chest 1 View Result Date: 06/12/2024 EXAM: 1 VIEW XRAY OF THE CHEST 06/12/2024 06:11:54 PM COMPARISON: None available. CLINICAL HISTORY: Fall. FINDINGS: LUNGS AND PLEURA: Low lung volumes. No focal pulmonary opacity. No pleural effusion. No pneumothorax. HEART AND MEDIASTINUM: No acute abnormality of the cardiac and mediastinal silhouettes. BONES AND SOFT TISSUES: No acute osseous abnormality. IMPRESSION: 1. No acute process. Electronically signed by: Norman Gatlin MD 06/12/2024 07:03 PM EST RP Workstation: [id number]   DG Hip Unilat W or Wo Pelvis 2-3 Views Left Result Date: 06/12/2024 EXAM:  2-3 VIEW(S) XRAY OF THE LEFT HIP 06/12/2024 06:11:54 PM COMPARISON: None available. CLINICAL HISTORY: Fall. FINDINGS: BONES AND JOINTS: Acute displaced and comminuted left intertrochanteric fracture. Displaced lesser trochanter. Femoral head remains in the acetabulum. SOFT TISSUES: Unremarkable. IMPRESSION: 1. Acute displaced and comminuted left intertrochanteric fracture with displaced lesser trochanter. Electronically signed by: Norman Gatlin MD 06/12/2024 07:03 PM EST RP Workstation: [id number]    Pertinent labs & imaging results that were available during my care of the patient were reviewed by me and considered in my  medical decision making (see MDM for details).  Medications Ordered in ED Medications  morphine  (PF) 4 MG/ML injection 4 mg (4 mg Intravenous Given 06/12/24 1745)  ondansetron  (ZOFRAN ) injection 4 mg (4 mg Intravenous Given 06/12/24 1744)  sodium chloride  0.9 % bolus 500 mL (0 mLs Intravenous Stopped 06/12/24 1931)  morphine  (PF) 2 MG/ML injection 2 mg (2 mg Intravenous Given 06/12/24 1937)  acetaminophen  (TYLENOL ) tablet 650 mg (650 mg Oral Given 06/12/24 1938)  methocarbamol  (ROBAXIN ) tablet 500 mg (500 mg Oral Given 06/12/24 1939)                                                                                                                                     Procedures Procedures  (including critical care time)  Medical Decision Making / ED Course    Medical Decision Making:    BENCE TRAPP is a 79 y.o. male  with past medical history as below, significant for mild stenosis, bilateral leg pain who presents to the ED with complaint of fall, hip pain. The complaint involves an extensive differential diagnosis and also carries with it a high risk of complications and morbidity.  Serious etiology was considered. Ddx includes but is not limited to: Fracture, dislocation, sprain, strain, contusion, etc.  Complete initial physical exam performed, notably the patient was in no acute distress, abnormality noted to left hip.    Reviewed and confirmed nursing documentation for past medical history, family history, social history.  Vital signs reviewed.    Mechanical trip and fall Left hip fracture> - X-ray on preliminary read appears to have fracture of the left hip, n.p.o., not anticoagulated.  Will consult Ortho - labs stable - LE NVI  Clinical Course as of 06/12/24 2007  Fri Jun 12, 2024  1913 Hip xr:  IMPRESSION: 1. Acute displaced and comminuted left intertrochanteric fracture with displaced lesser trochanter.   [SG]  1920 Pt reports has seen Dr Ernie borer for his knee, saw Dr  Burnetta for his spine  [SG]  1951 Dr ernie will be able to take care of it Sunday at [facility] [SG]    Clinical Course User Index [SG] Elnor Jayson LABOR, DO     Plan to admit the hospitalist, orthopedics will see on Sunday at [facility], Dr Ernie  Admit Dr Sherlon  Pt / family agreeable to care plan, all questions answered at bedside  Additional history obtained: -Additional history obtained from family -External records from outside source obtained and reviewed including: Chart review including previous notes, labs, imaging, consultation notes including  Home medications   Lab Tests: -I ordered, reviewed, and interpreted labs.   The pertinent results include:   Labs Reviewed  CBC WITH DIFFERENTIAL/PLATELET - Abnormal; Notable for the following components:      Result Value   WBC 11.9 (*)    MCV 100.2 (*)    Neutro Abs 9.7 (*)    Abs Immature Granulocytes 0.09 (*)    All other components within normal limits  BASIC METABOLIC PANEL WITH GFR - Abnormal; Notable for the following components:   CO2 20 (*)    Glucose, Bld 154 (*)    Anion gap 18 (*)    All other components within normal limits    Notable for labs are stable  EKG   EKG Interpretation Date/Time:    Ventricular Rate:    PR Interval:    QRS Duration:    QT Interval:    QTC Calculation:   R Axis:      Text Interpretation:           Imaging Studies ordered: I ordered imaging studies including chest and pelvis x-ray I independently visualized the following imaging with scope of interpretation limited to determining acute life threatening conditions related to emergency care; findings noted above I agree with the radiologist interpretation If any imaging was obtained with contrast I closely monitored patient for any possible adverse reaction a/w contrast administration in the emergency department   Medicines ordered and prescription drug management: Meds ordered this encounter   Medications   morphine  (PF) 4 MG/ML injection 4 mg   ondansetron  (ZOFRAN ) injection 4 mg   sodium chloride  0.9 % bolus 500 mL   morphine  (PF) 2 MG/ML injection 2 mg   DISCONTD: methocarbamol  (ROBAXIN ) tablet 1,000 mg   acetaminophen  (TYLENOL ) tablet 650 mg   methocarbamol  (ROBAXIN ) tablet 500 mg    -I have reviewed the patients home medicines and have made adjustments as needed   Consultations Obtained: I requested consultation with the ortho/hospitalist,  and discussed lab and imaging findings as well as pertinent plan - they recommend: plan surgery at [facility] sunday   Cardiac Monitoring: Continuous pulse oximetry interpreted by myself, 98% on RA.    Social Determinants of Health:  Diagnosis or treatment significantly limited by social determinants of health: former smoker   Reevaluation: After the interventions noted above, I reevaluated the patient and found that they have improved  Co morbidities that complicate the patient evaluation  Past Medical History:  Diagnosis Date   Leg pain, bilateral    Spinal stenosis       Dispostion: Disposition decision including need for hospitalization was considered, and patient admitted to the hospital.    Final Clinical Impression(s) / ED Diagnoses Final diagnoses:  Fall, initial encounter  Closed fracture of left femur, unspecified fracture morphology, unspecified portion of femur, initial encounter (HCC)          [1]  Social History Tobacco Use   Smoking status: Former    Current packs/day: 0.00    Types: Cigarettes    Quit date: 06/01/2002    Years since quitting: 22.0   Smokeless tobacco: Never  Substance Use Topics   Alcohol use: Yes    Comment: 2-3 beers per week   Drug use: No     Elnor Jayson LABOR, DO 06/12/24 2007  "

## 2024-06-12 NOTE — ED Triage Notes (Signed)
 Pt arrived via [facility] after being called out d/t a slip on the ice while carrying in his groceries. Pt complains of left-sided hip pain, rating currently a 5/10, coming down since the 8/10 with EMS. Patient reports no head strike and no LOC.

## 2024-06-13 ENCOUNTER — Encounter (HOSPITAL_COMMUNITY)
Admission: EM | Disposition: A | Discharge status: Skilled Nursing Facility | Payer: Self-pay | Source: Home / Self Care | Attending: Internal Medicine

## 2024-06-13 LAB — CBC
HCT: 38.7 % — ABNORMAL LOW (ref 39.0–52.0)
Hemoglobin: 13.4 g/dL (ref 13.0–17.0)
MCH: 34.3 pg — ABNORMAL HIGH (ref 26.0–34.0)
MCHC: 34.6 g/dL (ref 30.0–36.0)
MCV: 99 fL (ref 80.0–100.0)
Platelets: 184 10*3/uL (ref 150–400)
RBC: 3.91 MIL/uL — ABNORMAL LOW (ref 4.22–5.81)
RDW: 14.1 % (ref 11.5–15.5)
WBC: 12.9 10*3/uL — ABNORMAL HIGH (ref 4.0–10.5)
nRBC: 0 % (ref 0.0–0.2)

## 2024-06-13 LAB — BASIC METABOLIC PANEL WITH GFR
Anion gap: 12 (ref 5–15)
BUN: 23 mg/dL (ref 8–23)
CO2: 24 mmol/L (ref 22–32)
Calcium: 9.1 mg/dL (ref 8.9–10.3)
Chloride: 104 mmol/L (ref 98–111)
Creatinine, Ser: 1.02 mg/dL (ref 0.61–1.24)
GFR, Estimated: >60 mL/min
Glucose, Bld: 160 mg/dL — ABNORMAL HIGH (ref 70–99)
Potassium: 4.4 mmol/L (ref 3.5–5.1)
Sodium: 139 mmol/L (ref 135–145)

## 2024-06-13 LAB — SURGICAL PCR SCREEN
MRSA, PCR: NEGATIVE
Staphylococcus aureus: NEGATIVE

## 2024-06-13 MED ORDER — MORPHINE SULFATE (PF) 2 MG/ML IV SOLN
0.5000 mg | INTRAVENOUS | Status: DC | PRN
  Administered 2024-06-13: 1 mg via INTRAVENOUS
  Filled 2024-06-13 (×2): qty 1

## 2024-06-13 NOTE — Plan of Care (Signed)
  Problem: Education: Goal: Knowledge of General Education information will improve Description: Including pain rating scale, medication(s)/side effects and non-pharmacologic comfort measures Outcome: Progressing   Problem: Health Behavior/Discharge Planning: Goal: Ability to manage health-related needs will improve Outcome: Progressing   Problem: Clinical Measurements: Goal: Ability to maintain clinical measurements within normal limits will improve Outcome: Progressing   Problem: Activity: Goal: Risk for activity intolerance will decrease Outcome: Progressing   Problem: Nutrition: Goal: Adequate nutrition will be maintained Outcome: Progressing   Problem: Elimination: Goal: Will not experience complications related to bowel motility Outcome: Progressing Goal: Will not experience complications related to urinary retention Outcome: Progressing   Problem: Pain Managment: Goal: General experience of comfort will improve and/or be controlled Outcome: Progressing   Problem: Safety: Goal: Ability to remain free from injury will improve Outcome: Progressing   Problem: Skin Integrity: Goal: Risk for impaired skin integrity will decrease Outcome: Progressing

## 2024-06-13 NOTE — Plan of Care (Signed)

## 2024-06-13 NOTE — Progress Notes (Signed)
 " PROGRESS NOTE  Johnny Golden FMW:[id number] DOB: Oct 03, 1945 DOA: 06/12/2024 PCP: Wells Ditch, MD (Inactive)   LOS: 1 day   Brief Narrative / Interim history: 79 year old male with hypertension, osteoarthritis comes into the hospital with a fall, slipped on ice, found to have left hip fracture.  He initially presented to [facility] and then transferred to [facility] for orthopedic surgery consultation  Subjective / 24h Interval events: He is doing well this morning, and moderate pain in his hip.  He denies any chest pain, nausea denies any shortness of breath.  He has no abdominal pain, no nausea or vomiting  Assesement and Plan: Principal problem Left hip fracture -orthopedic surgery consulted, looks like he will be taken to the OR tomorrow.  Continue pain control, n.p.o. 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  He would benefit from weight loss  Scheduled Meds:  heparin   5,000 Units Subcutaneous Q8H   senna  1 tablet Oral BID   Continuous Infusions: PRN Meds:.bisacodyl , HYDROcodone -acetaminophen , magnesium  citrate, methocarbamol  **OR** methocarbamol  (ROBAXIN ) injection, morphine  injection, polyethylene glycol  Current Outpatient Medications  Medication Instructions   amLODipine  (NORVASC ) 5 mg, Every evening   HYDROcodone -acetaminophen  (NORCO) 10-325 MG tablet 1 tablet, Oral, Every 6 hours PRN   methocarbamol  (ROBAXIN ) 500 mg, Oral, 3 times daily PRN   Multiple Vitamin (MULTIVITAMIN WITH MINERALS) TABS tablet 1 tablet, Daily   ondansetron  (ZOFRAN ) 4 mg, Oral, Every 8 hours PRN    Diet Orders (From admission, onward)     Start     Ordered   06/14/24 0001  Diet NPO time specified  Diet effective now        06/12/24 2115   06/12/24 2109  Diet regular Room service appropriate? Yes; Fluid consistency: Thin  Diet effective now       Question Answer Comment  Room  service appropriate? Yes   Fluid consistency: Thin      06/12/24 2108            DVT prophylaxis: heparin  injection 5,000 Units Start: 06/12/24 2200 SCDs Start: 06/12/24 2109   Lab Results  Component Value Date   PLT 198 06/12/2024      Code Status: Full Code  Family Communication: No family at bedside  Status is: Inpatient Remains inpatient appropriate because: Severity of illness   Level of care: Med-Surg  Consultants:  Orthopedic surgery  Objective: Vitals:   06/12/24 2311 06/13/24 0122 06/13/24 0133 06/13/24 0521  BP: (!) 147/73 128/73  103/81  Pulse: 84 76  70  Resp: 18 18  18   Temp: 98.5 F (36.9 C) 98.8 F (37.1 C)  98.3 F (36.8 C)  TempSrc: Oral Oral  Oral  SpO2: 96% 96%  100%  Weight:   117 kg   Height:   6' 1 (1.854 m)     Intake/Output Summary (Last 24 hours) at 06/13/2024 0945 Last data filed at 06/13/2024 0250 Gross per 24 hour  Intake 120 ml  Output --  Net 120 ml   Wt Readings from Last 3 Encounters:  06/13/24 117 kg  06/08/16 110.8 kg  05/28/16 109.3 kg    Examination:  Constitutional: NAD Eyes: no scleral icterus ENMT: Mucous membranes are moist.  Neck: normal, supple Respiratory: clear to auscultation bilaterally, no wheezing, no crackles. Normal respiratory effort. No accessory muscle use.  Cardiovascular: Regular rate and rhythm, no murmurs / rubs / gallops. No LE edema.  Abdomen:  non distended, no tenderness. Bowel sounds positive.  Musculoskeletal: no clubbing / cyanosis.    Data Reviewed: I have independently reviewed following labs and imaging studies   CBC Recent Labs  Lab 06/12/24 1733  WBC 11.9*  HGB 15.3  HCT 45.9  PLT 198  MCV 100.2*  MCH 33.4  MCHC 33.3  RDW 13.8  LYMPHSABS 1.2  MONOABS 0.8  EOSABS 0.1  BASOSABS 0.0    Recent Labs  Lab 06/12/24 1733  NA 139  K 4.2  CL 102  CO2 20*  GLUCOSE 154*  BUN 20  CREATININE 0.97  CALCIUM 9.3     ------------------------------------------------------------------------------------------------------------------ No results for input(s): CHOL, HDL, LDLCALC, TRIG, CHOLHDL, LDLDIRECT in the last 72 hours.  No results found for: HGBA1C ------------------------------------------------------------------------------------------------------------------ No results for input(s): TSH, T4TOTAL, T3FREE, THYROIDAB in the last 72 hours.  Invalid input(s): FREET3  Cardiac Enzymes No results for input(s): CKMB, TROPONINI, MYOGLOBIN in the last 168 hours.  Invalid input(s): CK ------------------------------------------------------------------------------------------------------------------ No results found for: BNP  CBG: No results for input(s): GLUCAP in the last 168 hours.  No results found for this or any previous visit (from the past 240 hours).   Radiology Studies: DG Chest 1 View Result Date: 06/12/2024 EXAM: 1 VIEW XRAY OF THE CHEST 06/12/2024 06:11:54 PM COMPARISON: None available. CLINICAL HISTORY: Fall. FINDINGS: LUNGS AND PLEURA: Low lung volumes. No focal pulmonary opacity. No pleural effusion. No pneumothorax. HEART AND MEDIASTINUM: No acute abnormality of the cardiac and mediastinal silhouettes. BONES AND SOFT TISSUES: No acute osseous abnormality. IMPRESSION: 1. No acute process. Electronically signed by: Norman Gatlin MD 06/12/2024 07:03 PM EST RP Workstation: [id number]   DG Hip Unilat W or Wo Pelvis 2-3 Views Left Result Date: 06/12/2024 EXAM: 2-3 VIEW(S) XRAY OF THE LEFT HIP 06/12/2024 06:11:54 PM COMPARISON: None available. CLINICAL HISTORY: Fall. FINDINGS: BONES AND JOINTS: Acute displaced and comminuted left intertrochanteric fracture. Displaced lesser trochanter. Femoral head remains in the acetabulum. SOFT TISSUES: Unremarkable. IMPRESSION: 1. Acute displaced and comminuted left intertrochanteric fracture with displaced lesser  trochanter. Electronically signed by: Norman Gatlin MD 06/12/2024 07:03 PM EST RP Workstation: [id number]     Nilda Fendt, MD, PhD [facility]  Between 7 am - 7 pm I am available, please contact me via Amion (for emergencies) or Securechat (non urgent messages)  Between 7 pm - 7 am I am not available, please contact night coverage MD/APP via Amion  "

## 2024-06-13 NOTE — Progress Notes (Signed)
 Patient ID: Johnny Golden, male   DOB: 08-03-1945, 79 y.o.   MRN: [id number] Consult received for left hip fracture. Full note to follow prior to surgery Radiographically he was noted to have a left intertrochanteric femur fracture He was transferred from [facility] based on my past relationship with him. Plan will be for intramedullary nailing of his left hip tomorrow Preoperative orders placed including consent and n.p.o. status

## 2024-06-14 ENCOUNTER — Inpatient Hospital Stay (HOSPITAL_COMMUNITY): Admitting: Anesthesiology

## 2024-06-14 ENCOUNTER — Encounter (HOSPITAL_COMMUNITY)
Admission: EM | Disposition: A | Discharge status: Skilled Nursing Facility | Payer: Self-pay | Source: Home / Self Care | Attending: Internal Medicine

## 2024-06-14 ENCOUNTER — Inpatient Hospital Stay (HOSPITAL_COMMUNITY)

## 2024-06-14 DIAGNOSIS — I10 Essential (primary) hypertension: Secondary | ICD-10-CM | POA: Diagnosis not present

## 2024-06-14 DIAGNOSIS — Z87891 Personal history of nicotine dependence: Secondary | ICD-10-CM | POA: Diagnosis not present

## 2024-06-14 DIAGNOSIS — S72142A Displaced intertrochanteric fracture of left femur, initial encounter for closed fracture: Secondary | ICD-10-CM

## 2024-06-14 DIAGNOSIS — M1711 Unilateral primary osteoarthritis, right knee: Secondary | ICD-10-CM

## 2024-06-14 DIAGNOSIS — S72002A Fracture of unspecified part of neck of left femur, initial encounter for closed fracture: Secondary | ICD-10-CM | POA: Diagnosis not present

## 2024-06-14 LAB — BASIC METABOLIC PANEL WITH GFR
Anion gap: 8 (ref 5–15)
BUN: 25 mg/dL — ABNORMAL HIGH (ref 8–23)
CO2: 27 mmol/L (ref 22–32)
Calcium: 9 mg/dL (ref 8.9–10.3)
Chloride: 104 mmol/L (ref 98–111)
Creatinine, Ser: 0.95 mg/dL (ref 0.61–1.24)
GFR, Estimated: >60 mL/min
Glucose, Bld: 221 mg/dL — ABNORMAL HIGH (ref 70–99)
Potassium: 4.7 mmol/L (ref 3.5–5.1)
Sodium: 139 mmol/L (ref 135–145)

## 2024-06-14 LAB — CBC
HCT: 37.2 % — ABNORMAL LOW (ref 39.0–52.0)
Hemoglobin: 12.1 g/dL — ABNORMAL LOW (ref 13.0–17.0)
MCH: 33.3 pg (ref 26.0–34.0)
MCHC: 32.5 g/dL (ref 30.0–36.0)
MCV: 102.5 fL — ABNORMAL HIGH (ref 80.0–100.0)
Platelets: 152 10*3/uL (ref 150–400)
RBC: 3.63 MIL/uL — ABNORMAL LOW (ref 4.22–5.81)
RDW: 14 % (ref 11.5–15.5)
WBC: 10.9 10*3/uL — ABNORMAL HIGH (ref 4.0–10.5)
nRBC: 0 % (ref 0.0–0.2)

## 2024-06-14 LAB — ABO/RH: ABO/RH(D): O POS

## 2024-06-14 LAB — PROTIME-INR
INR: 1 (ref 0.8–1.2)
Prothrombin Time: 14 s (ref 11.4–15.2)

## 2024-06-14 LAB — MAGNESIUM: Magnesium: 2 mg/dL (ref 1.7–2.4)

## 2024-06-14 LAB — TYPE AND SCREEN
ABO/RH(D): O POS
Antibody Screen: NEGATIVE

## 2024-06-14 MED ORDER — CEFAZOLIN SODIUM-DEXTROSE 2-4 GM/100ML-% IV SOLN
2.0000 g | Freq: Four times a day (QID) | INTRAVENOUS | Status: AC
  Administered 2024-06-14 (×2): 2 g via INTRAVENOUS
  Filled 2024-06-14 (×2): qty 100

## 2024-06-14 MED ORDER — ONDANSETRON HCL 4 MG/2ML IJ SOLN
INTRAMUSCULAR | Status: DC | PRN
  Administered 2024-06-14: 4 mg via INTRAVENOUS

## 2024-06-14 MED ORDER — CHLORHEXIDINE GLUCONATE 4 % EX SOLN
60.0000 mL | Freq: Once | CUTANEOUS | Status: AC
  Administered 2024-06-14: 4 via TOPICAL
  Filled 2024-06-14: qty 15

## 2024-06-14 MED ORDER — SENNA 8.6 MG PO TABS
2.0000 | ORAL_TABLET | Freq: Every day | ORAL | Status: DC
  Administered 2024-06-14 – 2024-06-16 (×3): 17.2 mg via ORAL
  Filled 2024-06-14 (×3): qty 2

## 2024-06-14 MED ORDER — LIDOCAINE HCL 1 % IJ SOLN
INTRAMUSCULAR | Status: AC
  Filled 2024-06-14: qty 20

## 2024-06-14 MED ORDER — SUGAMMADEX SODIUM 200 MG/2ML IV SOLN
INTRAVENOUS | Status: DC | PRN
  Administered 2024-06-14: 200 mg via INTRAVENOUS

## 2024-06-14 MED ORDER — ACETAMINOPHEN 500 MG PO TABS
1000.0000 mg | ORAL_TABLET | Freq: Four times a day (QID) | ORAL | Status: DC
  Administered 2024-06-14 – 2024-06-17 (×12): 1000 mg via ORAL
  Filled 2024-06-14 (×13): qty 2

## 2024-06-14 MED ORDER — PHENOL 1.4 % MT LIQD: 1.0000 | OROMUCOSAL | Status: DC | PRN

## 2024-06-14 MED ORDER — KETOROLAC TROMETHAMINE 30 MG/ML IJ SOLN
INTRAMUSCULAR | Status: DC | PRN
  Administered 2024-06-14: 30 mg via INTRAVENOUS

## 2024-06-14 MED ORDER — DIPHENHYDRAMINE HCL 12.5 MG/5ML PO ELIX: 12.5000 mg | ORAL_SOLUTION | ORAL | Status: DC | PRN

## 2024-06-14 MED ORDER — PROPOFOL 10 MG/ML IV BOLUS
INTRAVENOUS | Status: DC | PRN
  Administered 2024-06-14: 150 mg via INTRAVENOUS

## 2024-06-14 MED ORDER — DEXAMETHASONE SOD PHOSPHATE PF 10 MG/ML IJ SOLN
10.0000 mg | Freq: Once | INTRAMUSCULAR | Status: AC
  Administered 2024-06-15: 10 mg via INTRAVENOUS
  Filled 2024-06-14: qty 1

## 2024-06-14 MED ORDER — ENSURE PRE-SURGERY PO LIQD
296.0000 mL | Freq: Once | ORAL | Status: AC
  Administered 2024-06-14: 296 mL via ORAL
  Filled 2024-06-14: qty 296

## 2024-06-14 MED ORDER — ACETAMINOPHEN 10 MG/ML IV SOLN
INTRAVENOUS | Status: DC | PRN
  Administered 2024-06-14: 1000 mg via INTRAVENOUS

## 2024-06-14 MED ORDER — STERILE WATER FOR IRRIGATION IR SOLN
Status: DC | PRN
  Administered 2024-06-14: 1000 mL

## 2024-06-14 MED ORDER — TRIAMCINOLONE ACETONIDE 40 MG/ML IJ SUSP
INTRAMUSCULAR | Status: AC
  Filled 2024-06-14: qty 1

## 2024-06-14 MED ORDER — METOCLOPRAMIDE HCL 5 MG PO TABS: 5.0000 mg | ORAL_TABLET | Freq: Three times a day (TID) | ORAL | Status: DC | PRN

## 2024-06-14 MED ORDER — ONDANSETRON HCL 4 MG PO TABS: 4.0000 mg | ORAL_TABLET | Freq: Four times a day (QID) | ORAL | Status: DC | PRN

## 2024-06-14 MED ORDER — ROCURONIUM BROMIDE 100 MG/10ML IV SOLN
INTRAVENOUS | Status: DC | PRN
  Administered 2024-06-14: 50 mg via INTRAVENOUS

## 2024-06-14 MED ORDER — MENTHOL 3 MG MT LOZG: 1.0000 | LOZENGE | OROMUCOSAL | Status: DC | PRN

## 2024-06-14 MED ORDER — FENTANYL CITRATE (PF) 100 MCG/2ML IJ SOLN
INTRAMUSCULAR | Status: DC | PRN
  Administered 2024-06-14: 50 ug via INTRAVENOUS
  Administered 2024-06-14: 100 ug via INTRAVENOUS
  Administered 2024-06-14: 50 ug via INTRAVENOUS

## 2024-06-14 MED ORDER — ALUM & MAG HYDROXIDE-SIMETH 200-200-20 MG/5ML PO SUSP: 30.0000 mL | ORAL | Status: DC | PRN

## 2024-06-14 MED ORDER — TRANEXAMIC ACID-NACL 1000-0.7 MG/100ML-% IV SOLN
INTRAVENOUS | Status: DC | PRN
  Administered 2024-06-14: 1000 mg via INTRAVENOUS

## 2024-06-14 MED ORDER — METHOCARBAMOL 500 MG PO TABS
500.0000 mg | ORAL_TABLET | Freq: Four times a day (QID) | ORAL | Status: DC | PRN
  Administered 2024-06-14 – 2024-06-16 (×3): 500 mg via ORAL
  Filled 2024-06-14 (×4): qty 1

## 2024-06-14 MED ORDER — HYDROMORPHONE HCL 1 MG/ML IJ SOLN: 0.5000 mg | INTRAMUSCULAR | Status: DC | PRN

## 2024-06-14 MED ORDER — FENTANYL CITRATE (PF) 100 MCG/2ML IJ SOLN
INTRAMUSCULAR | Status: AC
  Filled 2024-06-14: qty 2

## 2024-06-14 MED ORDER — BISACODYL 10 MG RE SUPP: 10.0000 mg | Freq: Every day | RECTAL | Status: DC | PRN

## 2024-06-14 MED ORDER — POLYETHYLENE GLYCOL 3350 17 G PO PACK
17.0000 g | PACK | Freq: Two times a day (BID) | ORAL | Status: DC
  Administered 2024-06-14 – 2024-06-17 (×6): 17 g via ORAL
  Filled 2024-06-14 (×7): qty 1

## 2024-06-14 MED ORDER — CEFAZOLIN SODIUM-DEXTROSE 2-3 GM-%(50ML) IV SOLR
INTRAVENOUS | Status: DC | PRN
  Administered 2024-06-14 (×2): 2 g via INTRAVENOUS

## 2024-06-14 MED ORDER — LIDOCAINE 1% INJECTION FOR CIRCUMCISION
INJECTION | INTRAVENOUS | Status: DC | PRN
  Administered 2024-06-14: 3 mL

## 2024-06-14 MED ORDER — LACTATED RINGERS IV SOLN: INTRAVENOUS | Status: DC | PRN

## 2024-06-14 MED ORDER — SUGAMMADEX SODIUM 200 MG/2ML IV SOLN
INTRAVENOUS | Status: AC
  Filled 2024-06-14: qty 2

## 2024-06-14 MED ORDER — DEXMEDETOMIDINE HCL IN NACL 80 MCG/20ML IV SOLN
INTRAVENOUS | Status: DC | PRN
  Administered 2024-06-14: 8 ug via INTRAVENOUS

## 2024-06-14 MED ORDER — ONDANSETRON HCL 4 MG/2ML IJ SOLN: 4.0000 mg | Freq: Four times a day (QID) | INTRAMUSCULAR | Status: DC | PRN

## 2024-06-14 MED ORDER — METOCLOPRAMIDE HCL 5 MG/ML IJ SOLN: 5.0000 mg | Freq: Three times a day (TID) | INTRAMUSCULAR | Status: DC | PRN

## 2024-06-14 MED ORDER — OXYCODONE HCL 5 MG PO TABS
5.0000 mg | ORAL_TABLET | ORAL | Status: DC | PRN
  Administered 2024-06-15: 10 mg via ORAL
  Administered 2024-06-16: 5 mg via ORAL
  Administered 2024-06-16: 10 mg via ORAL
  Filled 2024-06-14: qty 1
  Filled 2024-06-14 (×2): qty 2

## 2024-06-14 MED ORDER — SODIUM CHLORIDE 0.9 % IV SOLN: INTRAVENOUS | Status: DC

## 2024-06-14 MED ORDER — ASPIRIN 81 MG PO CHEW
81.0000 mg | CHEWABLE_TABLET | Freq: Two times a day (BID) | ORAL | Status: DC
  Administered 2024-06-14 – 2024-06-17 (×6): 81 mg via ORAL
  Filled 2024-06-14 (×6): qty 1

## 2024-06-14 MED ORDER — DEXAMETHASONE SODIUM PHOSPHATE 4 MG/ML IJ SOLN
INTRAMUSCULAR | Status: DC | PRN
  Administered 2024-06-14: 5 mg via INTRAVENOUS

## 2024-06-14 MED ORDER — 0.9 % SODIUM CHLORIDE (POUR BTL) OPTIME
TOPICAL | Status: DC | PRN
  Administered 2024-06-14: 1000 mL

## 2024-06-14 MED ORDER — OXYCODONE HCL 5 MG PO TABS
2.5000 mg | ORAL_TABLET | ORAL | Status: DC | PRN
  Administered 2024-06-14 – 2024-06-16 (×3): 5 mg via ORAL
  Filled 2024-06-14 (×3): qty 1

## 2024-06-14 MED ORDER — METHOCARBAMOL 1000 MG/10ML IJ SOLN: 500.0000 mg | Freq: Four times a day (QID) | INTRAMUSCULAR | Status: DC | PRN

## 2024-06-14 MED ORDER — LIDOCAINE HCL (CARDIAC) PF 100 MG/5ML IV SOSY
PREFILLED_SYRINGE | INTRAVENOUS | Status: DC | PRN
  Administered 2024-06-14: 100 mg via INTRAVENOUS

## 2024-06-14 NOTE — Progress Notes (Signed)
 " PROGRESS NOTE  Johnny Golden FMW:[id number] DOB: [birth year] DOA: 06/12/2024 PCP: Wells Ditch, MD (Inactive)   LOS: 2 days   Brief Narrative / Interim history: 79 year old male with hypertension, osteoarthritis comes into the hospital with a fall, slipped on ice, found to have left hip fracture.  He initially presented to [facility] and then transferred to [facility] for orthopedic surgery consultation  Subjective / 24h Interval events: He is doing well today, no chest pain, no shortness of breath.  Awaiting surgery  Assesement and Plan: Principal problem Left hip fracture -orthopedic surgery consulted, looks like he will be taken to the OR this morning.  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  He would benefit from weight loss  Scheduled Meds:  [MAR Hold] heparin   5,000 Units Subcutaneous Q8H   [MAR Hold] senna  1 tablet Oral BID   Continuous Infusions: PRN Meds:.[MAR Hold] bisacodyl , [MAR Hold] HYDROcodone -acetaminophen , [MAR Hold] magnesium  citrate, [MAR Hold] methocarbamol  **OR** [MAR Hold] methocarbamol  (ROBAXIN ) injection, [MAR Hold]  morphine  injection, [MAR Hold] polyethylene glycol  Current Outpatient Medications  Medication Instructions   amLODipine  (NORVASC ) 5 mg, Every evening   HYDROcodone -acetaminophen  (NORCO) 10-325 MG tablet 1 tablet, Oral, Every 6 hours PRN   methocarbamol  (ROBAXIN ) 500 mg, Oral, 3 times daily PRN   Multiple Vitamin (MULTIVITAMIN WITH MINERALS) TABS tablet 1 tablet, Daily   ondansetron  (ZOFRAN ) 4 mg, Oral, Every 8 hours PRN    Diet Orders (From admission, onward)     Start     Ordered   06/14/24 0430  Diet NPO time specified  Diet effective ____        06/13/24 1159            DVT prophylaxis: heparin  injection 5,000 Units Start: 06/12/24 2200 SCDs Start: 06/12/24 2109   Lab Results   Component Value Date   PLT 152 06/14/2024      Code Status: Full Code  Family Communication: Wife present at bedside  Status is: Inpatient Remains inpatient appropriate because: Severity of illness   Level of care: Med-Surg  Consultants:  Orthopedic surgery  Objective: Vitals:   06/13/24 2140 06/14/24 0536 06/14/24 0905 06/14/24 0924  BP: (!) 136/98 126/68 138/70 (!) 140/67  Pulse: 83 78 81 78  Resp: 16 18 20 20   Temp: 98.9 F (37.2 C) 98.4 F (36.9 C) 98.9 F (37.2 C) 98.4 F (36.9 C)  TempSrc: Oral Oral Oral   SpO2: 93% 98% 95% 95%  Weight:      Height:        Intake/Output Summary (Last 24 hours) at 06/14/2024 1021 Last data filed at 06/14/2024 0600 Gross per 24 hour  Intake 720 ml  Output 295 ml  Net 425 ml   Wt Readings from Last 3 Encounters:  06/13/24 117 kg  06/08/16 110.8 kg  05/28/16 109.3 kg    Examination:  Constitutional: NAD Eyes: lids and conjunctivae normal, no scleral icterus ENMT: mmm Neck: normal, supple Respiratory: clear to auscultation bilaterally, no wheezing, no crackles. Normal respiratory effort.  Cardiovascular: Regular rate and rhythm, no murmurs / rubs / gallops. No LE edema. Abdomen: soft, no distention, no tenderness. Bowel sounds positive.    Data Reviewed: I have independently reviewed following labs and imaging studies   CBC Recent Labs  Lab 06/12/24 1733 06/14/24 0410  WBC 11.9* 10.9*  HGB 15.3 12.1*  HCT 45.9 37.2*  PLT 198  152  MCV 100.2* 102.5*  MCH 33.4 33.3  MCHC 33.3 32.5  RDW 13.8 14.0  LYMPHSABS 1.2  --   MONOABS 0.8  --   EOSABS 0.1  --   BASOSABS 0.0  --     Recent Labs  Lab 06/12/24 1733 06/14/24 0410  NA 139 139  K 4.2 4.7  CL 102 104  CO2 20* 27  GLUCOSE 154* 221*  BUN 20 25*  CREATININE 0.97 0.95  CALCIUM 9.3 9.0  MG  --  2.0  INR  --  1.0    ------------------------------------------------------------------------------------------------------------------ No results for  input(s): CHOL, HDL, LDLCALC, TRIG, CHOLHDL, LDLDIRECT in the last 72 hours.  No results found for: HGBA1C ------------------------------------------------------------------------------------------------------------------ No results for input(s): TSH, T4TOTAL, T3FREE, THYROIDAB in the last 72 hours.  Invalid input(s): FREET3  Cardiac Enzymes No results for input(s): CKMB, TROPONINI, MYOGLOBIN in the last 168 hours.  Invalid input(s): CK ------------------------------------------------------------------------------------------------------------------ No results found for: BNP  CBG: No results for input(s): GLUCAP in the last 168 hours.  Recent Results (from the past 240 hours)  Surgical pcr screen     Status: None   Collection Time: 06/13/24  1:13 PM   Specimen: Nasal Mucosa; Nasal Swab  Result Value Ref Range Status   MRSA, PCR NEGATIVE NEGATIVE Final   Staphylococcus aureus NEGATIVE NEGATIVE Final    Comment: (NOTE) The Xpert SA Assay (FDA approved for NASAL specimens in patients 27 years of age and older), is one component of a comprehensive surveillance program. It is not intended to diagnose infection nor to guide or monitor treatment. Performed at [facility], [address], [place], [place] [address]      Radiology Studies: No results found.    Nilda Fendt, MD, PhD [facility]  Between 7 am - 7 pm I am available, please contact me via Amion (for emergencies) or Securechat (non urgent messages)  Between 7 pm - 7 am I am not available, please contact night coverage MD/APP via Amion  "

## 2024-06-14 NOTE — Consult Note (Addendum)
 Reason for Consult: Left hip fracture Referring Physician: Trixie, MD(Hospitalist)  Johnny Golden is an 79 y.o. male.  HPI: Johnny Golden is a 79 y.o. male who complains of  left hip pain s/p fall on ice.  After his fall he had inability to bear weight on the left leg and presented to [facility]. At [facility] he had x-rays obtained demonstrating a left intertrochanteric femur fracture and was then transferred to [facility] for surgical intervention due to his relationship with me. He has no numbness or tingling of the left lower extremity and no associated radiating pain.  The patient has no significant medical problems and has been treated by myself for knee arthritis in the past.   He was actually scheduled to come down to our office next week to have his right knee injected for his osteoarthritis and pain.  I spoke to his wife and himself regarding this this morning.  Past Medical History:  Diagnosis Date   Leg pain, bilateral    Spinal stenosis     Past Surgical History:  Procedure Laterality Date   LUMBAR LAMINECTOMY/DECOMPRESSION MICRODISCECTOMY N/A 06/14/2016   Procedure: Lumbar decompression L3-S1 LUMBAR LAMINECTOMY/DECOMPRESSION MICRODISCECTOMY 3 LEVELS;  Surgeon: Donaciano Sprang, MD;  Location: [facility] OR;  Service: Orthopedics;  Laterality: N/A;  Requests 3 hrs   TONSILLECTOMY AND ADENOIDECTOMY  1951    Family History  Problem Relation Age of Onset   Esophageal cancer Mother    Other Father        Unsure of history    Social History:  reports that he quit smoking about 22 years ago. His smoking use included cigarettes. He has never used smokeless tobacco. He reports current alcohol use. He reports that he does not use drugs.  Allergies: Allergies[1]  Medications: I have reviewed the patient's current medications. Scheduled:  heparin   5,000 Units Subcutaneous Q8H   senna  1 tablet Oral BID    Results for orders placed or performed during the hospital encounter of  06/12/24 (from the past 24 hours)  Surgical pcr screen     Status: None   Collection Time: 06/13/24  1:13 PM   Specimen: Nasal Mucosa; Nasal Swab  Result Value Ref Range   MRSA, PCR NEGATIVE NEGATIVE   Staphylococcus aureus NEGATIVE NEGATIVE  Basic metabolic panel with GFR     Status: Abnormal   Collection Time: 06/14/24  4:10 AM  Result Value Ref Range   Sodium 139 135 - 145 mmol/L   Potassium 4.7 3.5 - 5.1 mmol/L   Chloride 104 98 - 111 mmol/L   CO2 27 22 - 32 mmol/L   Glucose, Bld 221 (H) 70 - 99 mg/dL   BUN 25 (H) 8 - 23 mg/dL   Creatinine, Ser 9.04 0.61 - 1.24 mg/dL   Calcium 9.0 8.9 - 89.6 mg/dL   GFR, Estimated >39 >39 mL/min   Anion gap 8 5 - 15  CBC     Status: Abnormal   Collection Time: 06/14/24  4:10 AM  Result Value Ref Range   WBC 10.9 (H) 4.0 - 10.5 K/uL   RBC 3.63 (L) 4.22 - 5.81 MIL/uL   Hemoglobin 12.1 (L) 13.0 - 17.0 g/dL   HCT 62.7 (L) 60.9 - 47.9 %   MCV 102.5 (H) 80.0 - 100.0 fL   MCH 33.3 26.0 - 34.0 pg   MCHC 32.5 30.0 - 36.0 g/dL   RDW 85.9 88.4 - 84.4 %   Platelets 152 150 - 400  K/uL   nRBC 0.0 0.0 - 0.2 %  Magnesium      Status: None   Collection Time: 06/14/24  4:10 AM  Result Value Ref Range   Magnesium  2.0 1.7 - 2.4 mg/dL  Protime-INR     Status: None   Collection Time: 06/14/24  4:10 AM  Result Value Ref Range   Prothrombin Time 14.0 11.4 - 15.2 seconds   INR 1.0 0.8 - 1.2  Type and screen [facility]     Status: None   Collection Time: 06/14/24  4:13 AM  Result Value Ref Range   ABO/RH(D) O POS    Antibody Screen NEG    Sample Expiration      06/17/2024,2359 Performed at [facility], [address] [place], [place] [address]      X-ray: ERASMO: 2-3 VIEW(S) XRAY OF THE LEFT HIP 06/12/2024 06:11:54 PM   COMPARISON: None available.   CLINICAL HISTORY: Fall.   FINDINGS:   BONES AND JOINTS: Acute displaced and comminuted left intertrochanteric fracture. Displaced lesser trochanter.  Femoral head remains in the acetabulum.   SOFT TISSUES: Unremarkable.   IMPRESSION: 1. Acute displaced and comminuted left intertrochanteric fracture with displaced lesser trochanter.   Electronically signed by: Norman Gatlin MD 06/12/2024 07:03 PM EST RP  ROS: As per HPI  Blood pressure 126/68, pulse 78, temperature 98.4 F (36.9 C), temperature source Oral, resp. rate 18, height 6' 1 (1.854 m), weight 117 kg, SpO2 98%.  Physical Exam: General: Alert, no acute distress Cardiovascular: No pedal edema Respiratory: No cyanosis, no use of accessory musculature GI: No organomegaly, abdomen is soft and non-tender Skin: No lesions in the area of chief complaint Neurologic: Sensation intact distally Psychiatric: Patient is competent for consent with normal mood and affect Lymphatic: No axillary or cervical lymphadenopathy   MUSCULOSKELETAL: Left lower extremity - skin intact - TTP left hip - extremity shortened and externally rotated - pain with log roll - able to dorsiflex/plantarflex the ankle - sensation intact to light touch sural, saphenous, tibial, deep and superficial peroneal nerve distributions - 2+ DP pulse   Assessment/Plan: 1.  Left intertrochanteric hip fracture 2.  Right knee osteoarthritis and pain  Plan:  We plan to take him to the operating room today on 06/14/2024 for intramedullary nailing of his left intertrochanteric femur fracture At the time of the procedure we will also perform an intra-articular injection of cortisone into his right knee to help with his arthritis pain to help with his functional mobility after the operation. Currently n.p.o. for the surgery Consent obtained for management of his left hip fracture Postoperatively he will be partial weightbearing for up to 6 weeks. DVT prophylaxis likely aspirin  Disposition will be dependent on his therapy function postoperatively  Johnny Golden 06/14/2024, 6:37 AM         [1]   Allergies Allergen Reactions   No Known Allergies

## 2024-06-14 NOTE — Anesthesia Procedure Notes (Signed)
 Procedure Name: Intubation Date/Time: 06/14/2024 10:12 AM  Performed by: Dartha Meckel, CRNAPre-anesthesia Checklist: Patient identified, Emergency Drugs available, Suction available and Patient being monitored Patient Re-evaluated:Patient Re-evaluated prior to induction Oxygen Delivery Method: Circle system utilized Preoxygenation: Pre-oxygenation with 100% oxygen Induction Type: IV induction Ventilation: Mask ventilation without difficulty Laryngoscope Size: Glidescope and 4 Grade View: Grade I Tube type: Oral Number of attempts: 1 Airway Equipment and Method: Stylet and Oral airway Placement Confirmation: ETT inserted through vocal cords under direct vision, positive ETCO2 and breath sounds checked- equal and bilateral Secured at: 21 cm Tube secured with: Tape Dental Injury: Teeth and Oropharynx as per pre-operative assessment

## 2024-06-14 NOTE — Anesthesia Postprocedure Evaluation (Signed)
"   Anesthesia Post Note  Patient: Norleen HERO Roop  Procedure(s) Performed: INSERTION, INTRAMEDULLARY ROD, FEMUR (Left: Hip) KNEE INTRA-ARTICULAR INJECTION (Right: Knee)     Patient location during evaluation: PACU Anesthesia Type: General Level of consciousness: awake and alert Pain management: pain level controlled Vital Signs Assessment: post-procedure vital signs reviewed and stable Respiratory status: spontaneous breathing, nonlabored ventilation, respiratory function stable and patient connected to nasal cannula oxygen Cardiovascular status: blood pressure returned to baseline and stable Postop Assessment: no apparent nausea or vomiting Anesthetic complications: no   No notable events documented.  Last Vitals:  Vitals:   06/14/24 1245 06/14/24 1300  BP: (!) 144/81 129/82  Pulse: 67   Resp: 15 18  Temp:    SpO2: 95% 96%    Last Pain:  Vitals:   06/14/24 1300  TempSrc:   PainSc: 0-No pain                 Franky JONETTA Bald      "

## 2024-06-14 NOTE — Anesthesia Preprocedure Evaluation (Signed)
"                                    Anesthesia Evaluation  Patient identified by MRN, date of birth, ID band Patient awake    Reviewed: Allergy & Precautions, NPO status , Patient's Chart, lab work & pertinent test results  Airway Mallampati: II  TM Distance: >3 FB Neck ROM: Full    Dental  (+) Teeth Intact, Dental Advisory Given   Pulmonary former smoker   breath sounds clear to auscultation       Cardiovascular hypertension, Pt. on medications  Rhythm:Regular Rate:Normal     Neuro/Psych negative neurological ROS  negative psych ROS   GI/Hepatic negative GI ROS, Neg liver ROS,,,  Endo/Other  negative endocrine ROS    Renal/GU negative Renal ROS     Musculoskeletal negative musculoskeletal ROS (+)    Abdominal   Peds  Hematology negative hematology ROS (+)   Anesthesia Other Findings   Reproductive/Obstetrics                              Anesthesia Physical Anesthesia Plan  ASA: 3  Anesthesia Plan: General   Post-op Pain Management: Tylenol  PO (pre-op)*   Induction: Intravenous  PONV Risk Score and Plan: 3 and Ondansetron , Treatment may vary due to age or medical condition and TIVA  Airway Management Planned: Oral ETT  Additional Equipment: None  Intra-op Plan:   Post-operative Plan: Extubation in OR  Informed Consent: I have reviewed the patients History and Physical, chart, labs and discussed the procedure including the risks, benefits and alternatives for the proposed anesthesia with the patient or authorized representative who has indicated his/her understanding and acceptance.     Dental advisory given  Plan Discussed with: CRNA  Anesthesia Plan Comments:         Anesthesia Quick Evaluation  "

## 2024-06-14 NOTE — Plan of Care (Signed)
" °  Problem: Pain Managment: Goal: General experience of comfort will improve and/or be controlled Outcome: Progressing   Problem: Clinical Measurements: Goal: Ability to maintain clinical measurements within normal limits will improve Outcome: Progressing   Problem: Nutrition: Goal: Adequate nutrition will be maintained Outcome: Progressing   Problem: Safety: Goal: Ability to remain free from injury will improve Outcome: Progressing   Problem: Skin Integrity: Goal: Risk for impaired skin integrity will decrease Outcome: Progressing   "

## 2024-06-14 NOTE — Transfer of Care (Signed)
 Immediate Anesthesia Transfer of Care Note  Patient: Johnny Golden  Procedure(s) Performed: INSERTION, INTRAMEDULLARY ROD, FEMUR (Left: Hip) KNEE INTRA-ARTICULAR INJECTION (Right: Knee)  Patient Location: PACU  Anesthesia Type:General  Level of Consciousness: sedated  Airway & Oxygen Therapy: Patient Spontanous Breathing and Patient connected to face mask oxygen  Post-op Assessment: Report given to RN and Post -op Vital signs reviewed and stable  Post vital signs: Reviewed and stable  Last Vitals:  Vitals Value Taken Time  BP    Temp    Pulse 69 06/14/24 11:35  Resp 12 06/14/24 11:35  SpO2 100 % 06/14/24 11:35  Vitals shown include unfiled device data.  Last Pain:  Vitals:   06/14/24 0924  TempSrc:   PainSc: 0-No pain      Patients Stated Pain Goal: 0 (06/14/24 0030)  Complications: No notable events documented.

## 2024-06-14 NOTE — H&P (View-Only) (Signed)
 Reason for Consult: Left hip fracture Referring Physician: Trixie, MD(Hospitalist)  Johnny Golden is an 79 y.o. male.  HPI: Johnny Golden is a 79 y.o. male who complains of  left hip pain s/p fall on ice.  After his fall he had inability to bear weight on the left leg and presented to [facility]. At [facility] he had x-rays obtained demonstrating a left intertrochanteric femur fracture and was then transferred to [facility] for surgical intervention due to his relationship with me. He has no numbness or tingling of the left lower extremity and no associated radiating pain.  The patient has no significant medical problems and has been treated by myself for knee arthritis in the past.   He was actually scheduled to come down to our office next week to have his right knee injected for his osteoarthritis and pain.  I spoke to his wife and himself regarding this this morning.  Past Medical History:  Diagnosis Date   Leg pain, bilateral    Spinal stenosis     Past Surgical History:  Procedure Laterality Date   LUMBAR LAMINECTOMY/DECOMPRESSION MICRODISCECTOMY N/A 06/14/2016   Procedure: Lumbar decompression L3-S1 LUMBAR LAMINECTOMY/DECOMPRESSION MICRODISCECTOMY 3 LEVELS;  Surgeon: Donaciano Sprang, MD;  Location: [facility] OR;  Service: Orthopedics;  Laterality: N/A;  Requests 3 hrs   TONSILLECTOMY AND ADENOIDECTOMY  1951    Family History  Problem Relation Age of Onset   Esophageal cancer Mother    Other Father        Unsure of history    Social History:  reports that he quit smoking about 22 years ago. His smoking use included cigarettes. He has never used smokeless tobacco. He reports current alcohol use. He reports that he does not use drugs.  Allergies: Allergies[1]  Medications: I have reviewed the patient's current medications. Scheduled:  heparin   5,000 Units Subcutaneous Q8H   senna  1 tablet Oral BID    Results for orders placed or performed during the hospital encounter of  06/12/24 (from the past 24 hours)  Surgical pcr screen     Status: None   Collection Time: 06/13/24  1:13 PM   Specimen: Nasal Mucosa; Nasal Swab  Result Value Ref Range   MRSA, PCR NEGATIVE NEGATIVE   Staphylococcus aureus NEGATIVE NEGATIVE  Basic metabolic panel with GFR     Status: Abnormal   Collection Time: 06/14/24  4:10 AM  Result Value Ref Range   Sodium 139 135 - 145 mmol/L   Potassium 4.7 3.5 - 5.1 mmol/L   Chloride 104 98 - 111 mmol/L   CO2 27 22 - 32 mmol/L   Glucose, Bld 221 (H) 70 - 99 mg/dL   BUN 25 (H) 8 - 23 mg/dL   Creatinine, Ser 9.04 0.61 - 1.24 mg/dL   Calcium 9.0 8.9 - 89.6 mg/dL   GFR, Estimated >39 >39 mL/min   Anion gap 8 5 - 15  CBC     Status: Abnormal   Collection Time: 06/14/24  4:10 AM  Result Value Ref Range   WBC 10.9 (H) 4.0 - 10.5 K/uL   RBC 3.63 (L) 4.22 - 5.81 MIL/uL   Hemoglobin 12.1 (L) 13.0 - 17.0 g/dL   HCT 62.7 (L) 60.9 - 47.9 %   MCV 102.5 (H) 80.0 - 100.0 fL   MCH 33.3 26.0 - 34.0 pg   MCHC 32.5 30.0 - 36.0 g/dL   RDW 85.9 88.4 - 84.4 %   Platelets 152 150 - 400  K/uL   nRBC 0.0 0.0 - 0.2 %  Magnesium      Status: None   Collection Time: 06/14/24  4:10 AM  Result Value Ref Range   Magnesium  2.0 1.7 - 2.4 mg/dL  Protime-INR     Status: None   Collection Time: 06/14/24  4:10 AM  Result Value Ref Range   Prothrombin Time 14.0 11.4 - 15.2 seconds   INR 1.0 0.8 - 1.2  Type and screen [facility]     Status: None   Collection Time: 06/14/24  4:13 AM  Result Value Ref Range   ABO/RH(D) O POS    Antibody Screen NEG    Sample Expiration      06/17/2024,2359 Performed at [facility], [address] [place], [place] [address]      X-ray: ERASMO: 2-3 VIEW(S) XRAY OF THE LEFT HIP 06/12/2024 06:11:54 PM   COMPARISON: None available.   CLINICAL HISTORY: Fall.   FINDINGS:   BONES AND JOINTS: Acute displaced and comminuted left intertrochanteric fracture. Displaced lesser trochanter.  Femoral head remains in the acetabulum.   SOFT TISSUES: Unremarkable.   IMPRESSION: 1. Acute displaced and comminuted left intertrochanteric fracture with displaced lesser trochanter.   Electronically signed by: Norman Gatlin MD 06/12/2024 07:03 PM EST RP  ROS: As per HPI  Blood pressure 126/68, pulse 78, temperature 98.4 F (36.9 C), temperature source Oral, resp. rate 18, height 6' 1 (1.854 m), weight 117 kg, SpO2 98%.  Physical Exam: General: Alert, no acute distress Cardiovascular: No pedal edema Respiratory: No cyanosis, no use of accessory musculature GI: No organomegaly, abdomen is soft and non-tender Skin: No lesions in the area of chief complaint Neurologic: Sensation intact distally Psychiatric: Patient is competent for consent with normal mood and affect Lymphatic: No axillary or cervical lymphadenopathy   MUSCULOSKELETAL: Left lower extremity - skin intact - TTP left hip - extremity shortened and externally rotated - pain with log roll - able to dorsiflex/plantarflex the ankle - sensation intact to light touch sural, saphenous, tibial, deep and superficial peroneal nerve distributions - 2+ DP pulse   Assessment/Plan: 1.  Left intertrochanteric hip fracture 2.  Right knee osteoarthritis and pain  Plan:  We plan to take him to the operating room today on 06/14/2024 for intramedullary nailing of his left intertrochanteric femur fracture At the time of the procedure we will also perform an intra-articular injection of cortisone into his right knee to help with his arthritis pain to help with his functional mobility after the operation. Currently n.p.o. for the surgery Consent obtained for management of his left hip fracture Postoperatively he will be partial weightbearing for up to 6 weeks. DVT prophylaxis likely aspirin  Disposition will be dependent on his therapy function postoperatively  Johnny Golden 06/14/2024, 6:37 AM         [1]   Allergies Allergen Reactions   No Known Allergies

## 2024-06-14 NOTE — Brief Op Note (Signed)
 06/12/2024 - 06/14/2024  9:30 AM  11:25 AM  PATIENT:  Johnny Golden  79 y.o. male  PRE-OPERATIVE DIAGNOSIS:  1. LEFT intertrochanteric/subtrochanteric hip fracture 2. Right knee osteoarthritis   POST-OPERATIVE DIAGNOSIS:  1. LEFT intertrochanteric/subtrochanteric hip fracture 2. Right knee osteoarthritis   PROCEDURE:  Procedures: INSERTION, INTRAMEDULLARY ROD, FEMUR (Left) 2.  KNEE INTRA-ARTICULAR INJECTION (Right)  SURGEON:  Surgeons and Role:    DEWAINE Ernie Cough, MD - Primary  PHYSICIAN ASSISTANT: Rosina Calin, PA-C  ANESTHESIA:   general  EBL:  200cc   BLOOD ADMINISTERED:none  DRAINS: none   LOCAL MEDICATIONS USED:  NONE  SPECIMEN:  No Specimen  DISPOSITION OF SPECIMEN:  N/A  COUNTS:  YES  TOURNIQUET:  * No tourniquets in log *  DICTATION: .Other Dictation: Dictation Number [id number]  PLAN OF CARE: Admit to inpatient   PATIENT DISPOSITION:  PACU - hemodynamically stable.   Delay start of Pharmacological VTE agent (>24hrs) due to surgical blood loss or risk of bleeding: no

## 2024-06-14 NOTE — Interval H&P Note (Signed)
 History and Physical Interval Note:  06/14/2024 9:32 AM  Johnny Golden  has presented today for surgery, with the diagnosis of LEFT INTERTROCHANTERIC HIP FRACTURE.  The various methods of treatment have been discussed with the patient and family. After consideration of risks, benefits and other options for treatment, the patient has consented to  Procedures: INSERTION, INTRAMEDULLARY ROD, FEMUR (Left) as a surgical intervention.  The patient's history has been reviewed, patient examined, no change in status, stable for surgery.  I have reviewed the patient's chart and labs.  Questions were answered to the patient's satisfaction.     [name]

## 2024-06-14 NOTE — Consult Note (Signed)
 "  ORTHOPAEDIC CONSULTATION  REQUESTING PHYSICIAN: Trixie Nilda HERO, MD  PCP:  Wells Ditch, MD (Inactive)  Chief Complaint: Left intertrochanteric femur fracture  HPI: Johnny Golden is a 79 y.o. male who complains of  left hip pain s/p fall on ice.  After his fall he had inability to bear weight on the left leg and presented to [facility]. At [facility] he had x-rays obtained demonstrating a left intertrochanteric femur fracture and was then transferred to [facility] for surgical intervention. He has no numbness or tingling of the left lower extremity and no associated radiating pain.  The patient has no significant medical problems and has been treated by Dr. Ernie for knee arthritis in the past.  Past Medical History:  Diagnosis Date   Leg pain, bilateral    Spinal stenosis    Past Surgical History:  Procedure Laterality Date   LUMBAR LAMINECTOMY/DECOMPRESSION MICRODISCECTOMY N/A 06/14/2016   Procedure: Lumbar decompression L3-S1 LUMBAR LAMINECTOMY/DECOMPRESSION MICRODISCECTOMY 3 LEVELS;  Surgeon: Donaciano Sprang, MD;  Location: [facility] OR;  Service: Orthopedics;  Laterality: N/A;  Requests 3 hrs   TONSILLECTOMY AND ADENOIDECTOMY  1951   Social History   Socioeconomic History   Marital status: Married    Spouse name: Not on file   Number of children: 3   Years of education: College   Highest education level: Not on file  Occupational History   Occupation: Retired  Tobacco Use   Smoking status: Former    Current packs/day: 0.00    Types: Cigarettes    Quit date: 06/01/2002    Years since quitting: 22.0   Smokeless tobacco: Never  Substance and Sexual Activity   Alcohol use: Yes    Comment: 2-3 beers per week   Drug use: No   Sexual activity: Not on file  Other Topics Concern   Not on file  Social History Narrative   Lives at home with wife.   Right-handed.   2 cups caffeine per day.   Social Drivers of Health   Tobacco Use: Medium Risk (06/12/2024)   Patient  History    Smoking Tobacco Use: Former    Smokeless Tobacco Use: Never    Passive Exposure: Not on Actuary Strain: Not on file  Food Insecurity: No Food Insecurity (06/12/2024)   Epic    Worried About Programme Researcher, Broadcasting/film/video in the Last Year: Never true    Ran Out of Food in the Last Year: Never true  Transportation Needs: No Transportation Needs (06/12/2024)   Epic    Lack of Transportation (Medical): No    Lack of Transportation (Non-Medical): No  Physical Activity: Not on file  Stress: Not on file  Social Connections: Unknown (06/12/2024)   Social Connection and Isolation Panel    Frequency of Communication with Friends and Family: Not on file    Frequency of Social Gatherings with Friends and Family: Not on file    Attends Religious Services: Not on file    Active Member of Clubs or Organizations: Not on file    Attends Banker Meetings: Not on file    Marital Status: Married  Depression (EYV7-0): Not on file  Alcohol Screen: Not on file  Housing: Low Risk (06/12/2024)   Epic    Unable to Pay for Housing in the Last Year: No    Number of Times Moved in the Last Year: 0    Homeless in the Last Year: No  Utilities: Patient Declined (06/12/2024)  Epic    Threatened with loss of utilities: Patient declined  Health Literacy: Not on file   Family History  Problem Relation Age of Onset   Esophageal cancer Mother    Other Father        Unsure of history   Allergies[1] Prior to Admission medications  Medication Sig Start Date End Date Taking? Authorizing Provider  amLODipine  (NORVASC ) 5 MG tablet Take 5 mg by mouth every evening.   Yes [provider]  Multiple Vitamin (MULTIVITAMIN WITH MINERALS) TABS tablet Take 1 tablet by mouth daily.   Yes [provider]  HYDROcodone -acetaminophen  (NORCO) 10-325 MG tablet Take 1 tablet by mouth every 6 (six) hours as needed. Patient not taking: Reported on 08/02/2016 06/14/16   Burnetta Aures, MD   methocarbamol  (ROBAXIN ) 500 MG tablet Take 1 tablet (500 mg total) by mouth 3 (three) times daily as needed for muscle spasms. Patient not taking: Reported on 08/02/2016 06/14/16   Burnetta Aures, MD  ondansetron  (ZOFRAN ) 4 MG tablet Take 1 tablet (4 mg total) by mouth every 8 (eight) hours as needed for nausea or vomiting. Patient not taking: Reported on 08/02/2016 06/14/16   Burnetta Aures, MD   DG Chest 1 View Result Date: 06/12/2024 EXAM: 1 VIEW XRAY OF THE CHEST 06/12/2024 06:11:54 PM COMPARISON: None available. CLINICAL HISTORY: Fall. FINDINGS: LUNGS AND PLEURA: Low lung volumes. No focal pulmonary opacity. No pleural effusion. No pneumothorax. HEART AND MEDIASTINUM: No acute abnormality of the cardiac and mediastinal silhouettes. BONES AND SOFT TISSUES: No acute osseous abnormality. IMPRESSION: 1. No acute process. Electronically signed by: Norman Gatlin MD 06/12/2024 07:03 PM EST RP Workstation: [id number]   DG Hip Unilat W or Wo Pelvis 2-3 Views Left Result Date: 06/12/2024 EXAM: 2-3 VIEW(S) XRAY OF THE LEFT HIP 06/12/2024 06:11:54 PM COMPARISON: None available. CLINICAL HISTORY: Fall. FINDINGS: BONES AND JOINTS: Acute displaced and comminuted left intertrochanteric fracture. Displaced lesser trochanter. Femoral head remains in the acetabulum. SOFT TISSUES: Unremarkable. IMPRESSION: 1. Acute displaced and comminuted left intertrochanteric fracture with displaced lesser trochanter. Electronically signed by: Norman Gatlin MD 06/12/2024 07:03 PM EST RP Workstation: [id number]    Positive ROS: All other systems have been reviewed and were otherwise negative with the exception of those mentioned in the HPI and as above.  Physical Exam: General: Alert, no acute distress Cardiovascular: No pedal edema Respiratory: No cyanosis, no use of accessory musculature GI: No organomegaly, abdomen is soft and non-tender Skin: No lesions in the area of chief complaint Neurologic: Sensation intact  distally Psychiatric: Patient is competent for consent with normal mood and affect Lymphatic: No axillary or cervical lymphadenopathy  MUSCULOSKELETAL: Left lower extremity - skin intact - TTP left hip - extremity shortened and externally rotated - pain with log roll - able to dorsiflex/plantarflex the ankle - sensation intact to light touch sural, saphenous, tibial, deep and superficial peroneal nerve distributions - 2+ DP pulse  Imaging:  X-rays: 3  views of  left hip were obtained and reviewed.  My independent interpretation is as follows: displaced and shortened intertrochanteric femur fracture  Assessment: 79 year old male s/p fall with left intertrochanteric femur fracture planned for left femur cephalomedullary nail insertion with my partner Adina Car tomorrow.  NPO at midnight.  Pre op clearance per primary.  Plan: NWB LLE NPO at midnight Pre op clearance per internal medicine Pre op labs obtained Pain control: oxycodone  PT/OT post op Plan for OR in am  Dispo: pending OR   Rankin LELON Pizza, MD  06/14/2024 12:20 AM     [1]  Allergies Allergen Reactions   No Known Allergies    "

## 2024-06-14 NOTE — Discharge Instructions (Signed)

## 2024-06-14 NOTE — Plan of Care (Signed)

## 2024-06-15 ENCOUNTER — Encounter (HOSPITAL_COMMUNITY): Payer: Self-pay | Admitting: Orthopedic Surgery

## 2024-06-15 DIAGNOSIS — S72002A Fracture of unspecified part of neck of left femur, initial encounter for closed fracture: Secondary | ICD-10-CM | POA: Diagnosis not present

## 2024-06-15 LAB — COMPREHENSIVE METABOLIC PANEL WITH GFR
ALT: 30 U/L (ref 0–44)
AST: 40 U/L (ref 15–41)
Albumin: 3.5 g/dL (ref 3.5–5.0)
Alkaline Phosphatase: 59 U/L (ref 38–126)
Anion gap: 7 (ref 5–15)
BUN: 27 mg/dL — ABNORMAL HIGH (ref 8–23)
CO2: 28 mmol/L (ref 22–32)
Calcium: 8.9 mg/dL (ref 8.9–10.3)
Chloride: 103 mmol/L (ref 98–111)
Creatinine, Ser: 1.05 mg/dL (ref 0.61–1.24)
GFR, Estimated: >60 mL/min
Glucose, Bld: 147 mg/dL — ABNORMAL HIGH (ref 70–99)
Potassium: 4.8 mmol/L (ref 3.5–5.1)
Sodium: 138 mmol/L (ref 135–145)
Total Bilirubin: 0.5 mg/dL (ref 0.0–1.2)
Total Protein: 5.9 g/dL — ABNORMAL LOW (ref 6.5–8.1)

## 2024-06-15 LAB — CBC
HCT: 33.1 % — ABNORMAL LOW (ref 39.0–52.0)
Hemoglobin: 11 g/dL — ABNORMAL LOW (ref 13.0–17.0)
MCH: 34.1 pg — ABNORMAL HIGH (ref 26.0–34.0)
MCHC: 33.2 g/dL (ref 30.0–36.0)
MCV: 102.5 fL — ABNORMAL HIGH (ref 80.0–100.0)
Platelets: 154 10*3/uL (ref 150–400)
RBC: 3.23 MIL/uL — ABNORMAL LOW (ref 4.22–5.81)
RDW: 13.8 % (ref 11.5–15.5)
WBC: 15.6 10*3/uL — ABNORMAL HIGH (ref 4.0–10.5)
nRBC: 0 % (ref 0.0–0.2)

## 2024-06-15 LAB — MAGNESIUM: Magnesium: 2.2 mg/dL (ref 1.7–2.4)

## 2024-06-15 MED ORDER — AMLODIPINE BESYLATE 5 MG PO TABS
5.0000 mg | ORAL_TABLET | Freq: Every day | ORAL | Status: DC
  Administered 2024-06-15 – 2024-06-17 (×3): 5 mg via ORAL
  Filled 2024-06-15 (×3): qty 1

## 2024-06-15 MED ORDER — TRANEXAMIC ACID-NACL 1000-0.7 MG/100ML-% IV SOLN: 1000.0000 mg | Freq: Once | INTRAVENOUS | Status: DC

## 2024-06-15 MED ORDER — ADULT MULTIVITAMIN W/MINERALS CH
1.0000 | ORAL_TABLET | Freq: Every day | ORAL | Status: DC
  Administered 2024-06-15 – 2024-06-17 (×3): 1 via ORAL
  Filled 2024-06-15 (×3): qty 1

## 2024-06-15 MED ORDER — ENSURE MAX PROTEIN PO LIQD
11.0000 [oz_av] | Freq: Every day | ORAL | Status: DC
  Administered 2024-06-16: 11 [oz_av] via ORAL
  Filled 2024-06-15 (×3): qty 330

## 2024-06-16 DIAGNOSIS — S72002A Fracture of unspecified part of neck of left femur, initial encounter for closed fracture: Secondary | ICD-10-CM | POA: Diagnosis not present

## 2024-06-16 LAB — CBC
HCT: 31.3 % — ABNORMAL LOW (ref 39.0–52.0)
Hemoglobin: 10.4 g/dL — ABNORMAL LOW (ref 13.0–17.0)
MCH: 33.8 pg (ref 26.0–34.0)
MCHC: 33.2 g/dL (ref 30.0–36.0)
MCV: 101.6 fL — ABNORMAL HIGH (ref 80.0–100.0)
Platelets: 174 10*3/uL (ref 150–400)
RBC: 3.08 MIL/uL — ABNORMAL LOW (ref 4.22–5.81)
RDW: 13.9 % (ref 11.5–15.5)
WBC: 15.2 10*3/uL — ABNORMAL HIGH (ref 4.0–10.5)
nRBC: 0.2 % (ref 0.0–0.2)

## 2024-06-16 LAB — BASIC METABOLIC PANEL WITH GFR
Anion gap: 7 (ref 5–15)
BUN: 30 mg/dL — ABNORMAL HIGH (ref 8–23)
CO2: 29 mmol/L (ref 22–32)
Calcium: 9.2 mg/dL (ref 8.9–10.3)
Chloride: 100 mmol/L (ref 98–111)
Creatinine, Ser: 0.91 mg/dL (ref 0.61–1.24)
GFR, Estimated: >60 mL/min
Glucose, Bld: 158 mg/dL — ABNORMAL HIGH (ref 70–99)
Potassium: 4.1 mmol/L (ref 3.5–5.1)
Sodium: 136 mmol/L (ref 135–145)

## 2024-06-16 NOTE — TOC Progression Note (Signed)
 Transition of Care [facility]) - Progression Note    Patient Details  Name: Johnny Golden MRN: [id number] Date of Birth: 16-Aug-1945  Transition of Care [facility]) CM/SW Contact  NORMAN ASPEN, LCSW Phone Number: 06/16/2024, 10:35 AM  Clinical Narrative:     Pt and spouse have accepted SNF bed offer at [facility].  Have confirmed with facility that they can admit patient tomorrow.  Have received insurance authorization (auth# [id number]).  Pt/spouse/ MD and RN aware.   Expected Discharge Plan: Skilled Nursing Facility Barriers to Discharge: Continued Medical Work up               Expected Discharge Plan and Services       Living arrangements for the past 2 months: Single Family Home                                       Social Drivers of Health (SDOH) Interventions SDOH Screenings   Food Insecurity: No Food Insecurity (06/12/2024)  Housing: Low Risk (06/12/2024)  Transportation Needs: No Transportation Needs (06/12/2024)  Utilities: Patient Declined (06/12/2024)  Social Connections: Unknown (06/12/2024)  Tobacco Use: Medium Risk (06/12/2024)    Readmission Risk Interventions    06/15/2024    3:10 PM  Readmission Risk Prevention Plan  Post Dischage Appt Complete  Medication Screening Complete  Transportation Screening Complete

## 2024-06-16 NOTE — Plan of Care (Signed)

## 2024-06-16 NOTE — Plan of Care (Signed)
" °  Problem: Education: Goal: Knowledge of General Education information will improve Description: Including pain rating scale, medication(s)/side effects and non-pharmacologic comfort measures Outcome: Adequate for Discharge   Problem: Health Behavior/Discharge Planning: Goal: Ability to manage health-related needs will improve Outcome: Adequate for Discharge   Problem: Clinical Measurements: Goal: Ability to maintain clinical measurements within normal limits will improve Outcome: Progressing Goal: Will remain free from infection Outcome: Progressing Goal: Diagnostic test results will improve Outcome: Progressing Goal: Respiratory complications will improve Outcome: Adequate for Discharge Goal: Cardiovascular complication will be avoided Outcome: Adequate for Discharge   Problem: Activity: Goal: Risk for activity intolerance will decrease Outcome: Progressing   Problem: Nutrition: Goal: Adequate nutrition will be maintained Outcome: Progressing   Problem: Coping: Goal: Level of anxiety will decrease Outcome: Progressing   Problem: Elimination: Goal: Will not experience complications related to bowel motility Outcome: Progressing Goal: Will not experience complications related to urinary retention Outcome: Completed/Met   Problem: Pain Managment: Goal: General experience of comfort will improve and/or be controlled Outcome: Progressing   Problem: Safety: Goal: Ability to remain free from injury will improve Outcome: Progressing   Problem: Skin Integrity: Goal: Risk for impaired skin integrity will decrease Outcome: Progressing   Problem: Education: Goal: Verbalization of understanding the information provided (i.e., activity precautions, restrictions, etc) will improve Outcome: Progressing Goal: Individualized Educational Video(s) Outcome: Progressing   Problem: Activity: Goal: Ability to ambulate and perform ADLs will improve Outcome: Progressing    Problem: Clinical Measurements: Goal: Postoperative complications will be avoided or minimized Outcome: Progressing   Problem: Self-Concept: Goal: Ability to maintain and perform role responsibilities to the fullest extent possible will improve Outcome: Progressing   Problem: Pain Management: Goal: Pain level will decrease Outcome: Adequate for Discharge   "

## 2024-06-16 NOTE — Progress Notes (Signed)
 Patient has pulled out his second IV while sleeping. Patient and wife have asked to please not place another IV. Notified [place], NP

## 2024-06-17 LAB — CBC
HCT: 32.6 % — ABNORMAL LOW (ref 39.0–52.0)
Hemoglobin: 10.7 g/dL — ABNORMAL LOW (ref 13.0–17.0)
MCH: 33.9 pg (ref 26.0–34.0)
MCHC: 32.8 g/dL (ref 30.0–36.0)
MCV: 103.2 fL — ABNORMAL HIGH (ref 80.0–100.0)
Platelets: 204 10*3/uL (ref 150–400)
RBC: 3.16 MIL/uL — ABNORMAL LOW (ref 4.22–5.81)
RDW: 14.2 % (ref 11.5–15.5)
WBC: 15.4 10*3/uL — ABNORMAL HIGH (ref 4.0–10.5)
nRBC: 0.4 % — ABNORMAL HIGH (ref 0.0–0.2)

## 2024-06-17 LAB — BASIC METABOLIC PANEL WITH GFR
Anion gap: 9 (ref 5–15)
BUN: 38 mg/dL — ABNORMAL HIGH (ref 8–23)
CO2: 30 mmol/L (ref 22–32)
Calcium: 8.9 mg/dL (ref 8.9–10.3)
Chloride: 99 mmol/L (ref 98–111)
Creatinine, Ser: 0.88 mg/dL (ref 0.61–1.24)
GFR, Estimated: >60 mL/min
Glucose, Bld: 150 mg/dL — ABNORMAL HIGH (ref 70–99)
Potassium: 4.2 mmol/L (ref 3.5–5.1)
Sodium: 138 mmol/L (ref 135–145)

## 2024-06-17 LAB — MAGNESIUM: Magnesium: 2.7 mg/dL — ABNORMAL HIGH (ref 1.7–2.4)

## 2024-06-17 MED ORDER — METHOCARBAMOL 500 MG PO TABS: 500.0000 mg | ORAL_TABLET | Freq: Four times a day (QID) | ORAL | 0 refills | Status: AC | PRN

## 2024-06-17 MED ORDER — ASPIRIN 81 MG PO CHEW: 81.0000 mg | CHEWABLE_TABLET | Freq: Two times a day (BID) | ORAL | 0 refills | Status: AC

## 2024-06-17 MED ORDER — BISACODYL 10 MG RE SUPP
10.0000 mg | Freq: Once | RECTAL | Status: AC
  Administered 2024-06-17: 10 mg via RECTAL
  Filled 2024-06-17: qty 1

## 2024-06-17 MED ORDER — ACETAMINOPHEN 500 MG PO TABS: 1000.0000 mg | ORAL_TABLET | Freq: Three times a day (TID) | ORAL | Status: AC

## 2024-06-17 MED ORDER — POLYETHYLENE GLYCOL 3350 17 G PO PACK: 17.0000 g | PACK | Freq: Two times a day (BID) | ORAL | 0 refills | Status: AC

## 2024-06-17 MED ORDER — BISACODYL 5 MG PO TBEC: 5.0000 mg | DELAYED_RELEASE_TABLET | Freq: Every day | ORAL | Status: AC | PRN

## 2024-06-17 MED ORDER — SENNA 8.6 MG PO TABS: 2.0000 | ORAL_TABLET | Freq: Every day | ORAL | 0 refills | Status: AC

## 2024-06-17 MED ORDER — OXYCODONE HCL 5 MG PO TABS: 5.0000 mg | ORAL_TABLET | ORAL | 0 refills | Status: AC | PRN

## 2024-06-17 MED ORDER — BISACODYL 10 MG RE SUPP: 10.0000 mg | Freq: Every day | RECTAL | Status: AC | PRN

## 2024-06-17 NOTE — Discharge Summary (Signed)
 " Physician Discharge Summary   Patient: Johnny Golden MRN: [id number] DOB: August 31, 1945  Admit date:     06/12/2024  Discharge date: 06/17/24  Discharge Physician: Lonni SHAUNNA Dalton   PCP: Wells Ditch, MD (Inactive)     Recommendations at discharge:  Follow up with Orthopedics Dr. Ernie in 1-2 weeks for hip fracture repair Follow up with PCP one week after discharge from SNF Check CBC in 2 weeks     Discharge Diagnoses: Principal Problem:   Hip fracture [facility]) Other hospital problems:   Anemia   Hypertension   Class 1 obesity      Hospital Course: 79 y.o. M with HTN, arthritis, presented with hip fracture.  Slipped on ice at home while carrying in his groceries.    Admitted and Orthopedics Dr Ernie consulted.    Left hip fracture  Orthopedic surgery consulted, he was taken to the OR 2/1 for IM nail of left hip.  Orthopedics recommend aspirin  81 BID for 1 month.        Essential hypertension Stable on amlodipine .   Leukocytosis No evidence of infection.  Likely reactive.  Check CBC in 2 weeks.   Anemia Expected anemia after surgery.  No treatment needed.  Stabilized.          The [place]  Controlled Substances Registry was reviewed for this patient prior to discharge.  Consultants: Orthopedics Procedures performed: IM Nail  Disposition: Skilled nursing facility Diet recommendation:  Regular diet  DISCHARGE MEDICATION: Allergies as of 06/17/2024       Reactions   No Known Allergies         Medication List     STOP taking these medications    HYDROcodone -acetaminophen  10-325 MG tablet Commonly known as: Norco       TAKE these medications    acetaminophen  500 MG tablet Commonly known as: TYLENOL  Take 2 tablets (1,000 mg total) by mouth every 8 (eight) hours.   amLODipine  5 MG tablet Commonly known as: NORVASC  Take 5 mg by mouth every evening.   aspirin  81 MG chewable tablet Chew 1 tablet (81 mg total) by mouth 2 (two)  times daily for 28 days.   bisacodyl  5 MG EC tablet Commonly known as: DULCOLAX Take 1 tablet (5 mg total) by mouth daily as needed for moderate constipation.   bisacodyl  10 MG suppository Commonly known as: DULCOLAX Place 1 suppository (10 mg total) rectally daily as needed for moderate constipation.   methocarbamol  500 MG tablet Commonly known as: ROBAXIN  Take 1 tablet (500 mg total) by mouth every 6 (six) hours as needed (thigh pain / muscle pain). What changed:  when to take this reasons to take this   multivitamin with minerals Tabs tablet Take 1 tablet by mouth daily.   ondansetron  4 MG tablet Commonly known as: Zofran  Take 1 tablet (4 mg total) by mouth every 8 (eight) hours as needed for nausea or vomiting.   oxyCODONE  5 MG immediate release tablet Commonly known as: Oxy IR/ROXICODONE  Take 1 tablet (5 mg total) by mouth every 4 (four) hours as needed for severe pain (pain score 7-10).   polyethylene glycol 17 g packet Commonly known as: MIRALAX  / GLYCOLAX  Take 17 g by mouth 2 (two) times daily.   senna 8.6 MG Tabs tablet Commonly known as: SENOKOT Take 2 tablets (17.2 mg total) by mouth at bedtime for 14 days.               Discharge Care Instructions  (From admission,  onward)           Start     Ordered   06/17/24 0000  Discharge wound care:       Comments: Leave in place until Orthopedics office follow up   06/17/24 1319            Follow-up Information     Ernie Cough, MD. Schedule an appointment as soon as possible for a visit in 2 week(s).   Specialty: Orthopedic Surgery Contact information: [address] [place] [address] [place] [address] [phone]         Wells Ditch, MD. Schedule an appointment as soon as possible for a visit.   Specialty: Family Medicine Why: One week after discharge from rehab Contact information: [address] [place] [address] [phone]                 Discharge  Instructions     Discharge wound care:   Complete by: As directed    Leave in place until Orthopedics office follow up   Increase activity slowly   Complete by: As directed        Discharge Exam: Filed Weights   06/12/24 1641 06/13/24 0133  Weight: 111.1 kg 117 kg    General: Pt is alert, awake, not in acute distress Cardiovascular: RRR, nl S1-S2, no murmurs appreciated.   No LE edema.   Respiratory: Normal respiratory rate and rhythm.  CTAB without rales or wheezes. Abdominal: Abdomen soft and non-tender.  No distension or HSM.   Neuro/Psych: Strength symmetric in upper and lower extremities.  Judgment and insight appear normal.   Condition at discharge: good  The results of significant diagnostics from this hospitalization (including imaging, microbiology, ancillary and laboratory) are listed below for reference.   Imaging Studies: DG HIP UNILAT WITH PELVIS 1V LEFT Result Date: 06/14/2024 CLINICAL DATA:  Elective surgery/left femoral intramedullary nail. EXAM: DG HIP (WITH OR WITHOUT PELVIS) 1V*L* COMPARISON:  06/12/2024 FINDINGS: Evidence of fixation of patient's displaced intertrochanteric fracture of the left proximal femur with placement of intramedullary nail and associated compression screw bridging the femoral neck into the femoral head as hardware is intact with near anatomic alignment over the fracture site. Minimal degenerative change over the left hip and knee. IMPRESSION: Fixation of patient's displaced intertrochanteric fracture of the left proximal femur with near anatomic alignment. Electronically Signed   By: Toribio Agreste M.D.   On: 06/14/2024 11:38   DG C-Arm 1-60 Min-No Report Result Date: 06/14/2024 Fluoroscopy was utilized by the requesting physician.  No radiographic interpretation.   DG C-Arm 1-60 Min-No Report Result Date: 06/14/2024 Fluoroscopy was utilized by the requesting physician.  No radiographic interpretation.   DG Chest 1 View Result Date:  06/12/2024 EXAM: 1 VIEW XRAY OF THE CHEST 06/12/2024 06:11:54 PM COMPARISON: None available. CLINICAL HISTORY: Fall. FINDINGS: LUNGS AND PLEURA: Low lung volumes. No focal pulmonary opacity. No pleural effusion. No pneumothorax. HEART AND MEDIASTINUM: No acute abnormality of the cardiac and mediastinal silhouettes. BONES AND SOFT TISSUES: No acute osseous abnormality. IMPRESSION: 1. No acute process. Electronically signed by: Norman Gatlin MD 06/12/2024 07:03 PM EST RP Workstation: [id number]   DG Hip Unilat W or Wo Pelvis 2-3 Views Left Result Date: 06/12/2024 EXAM: 2-3 VIEW(S) XRAY OF THE LEFT HIP 06/12/2024 06:11:54 PM COMPARISON: None available. CLINICAL HISTORY: Fall. FINDINGS: BONES AND JOINTS: Acute displaced and comminuted left intertrochanteric fracture. Displaced lesser trochanter. Femoral head remains in the acetabulum. SOFT TISSUES: Unremarkable. IMPRESSION: 1. Acute displaced and comminuted  left intertrochanteric fracture with displaced lesser trochanter. Electronically signed by: Norman Gatlin MD 06/12/2024 07:03 PM EST RP Workstation: [id number]    Microbiology: Results for orders placed or performed during the hospital encounter of 06/12/24  Surgical pcr screen     Status: None   Collection Time: 06/13/24  1:13 PM   Specimen: Nasal Mucosa; Nasal Swab  Result Value Ref Range Status   MRSA, PCR NEGATIVE NEGATIVE Final   Staphylococcus aureus NEGATIVE NEGATIVE Final    Comment: (NOTE) The Xpert SA Assay (FDA approved for NASAL specimens in patients 78 years of age and older), is one component of a comprehensive surveillance program. It is not intended to diagnose infection nor to guide or monitor treatment. Performed at [facility], [address], [place], [place] [address]     Labs: CBC: Recent Labs  Lab 06/12/24 1733 06/13/24 0319 06/14/24 0410 06/15/24 0326 06/16/24 0255 06/17/24 0322  WBC 11.9* 12.9* 10.9* 15.6* 15.2* 15.4*  NEUTROABS 9.7*   --   --   --   --   --   HGB 15.3 13.4 12.1* 11.0* 10.4* 10.7*  HCT 45.9 38.7* 37.2* 33.1* 31.3* 32.6*  MCV 100.2* 99.0 102.5* 102.5* 101.6* 103.2*  PLT 198 184 152 154 174 204   Basic Metabolic Panel: Recent Labs  Lab 06/13/24 0319 06/14/24 0410 06/15/24 0326 06/16/24 0447 06/17/24 0322  NA 139 139 138 136 138  K 4.4 4.7 4.8 4.1 4.2  CL 104 104 103 100 99  CO2 24 27 28 29 30   GLUCOSE 160* 221* 147* 158* 150*  BUN 23 25* 27* 30* 38*  CREATININE 1.02 0.95 1.05 0.91 0.88  CALCIUM 9.1 9.0 8.9 9.2 8.9  MG  --  2.0 2.2  --  2.7*   Liver Function Tests: Recent Labs  Lab 06/15/24 0326  AST 40  ALT 30  ALKPHOS 59  BILITOT 0.5  PROT 5.9*  ALBUMIN 3.5   CBG: No results for input(s): GLUCAP in the last 168 hours.  Discharge time spent: approximately 45 minutes spent on discharge counseling, evaluation of patient on day of discharge, and coordination of discharge planning with nursing, social work, pharmacy and case management  Signed: Lonni SHAUNNA Dalton, MD [facility] 06/17/2024         "

## 2024-06-17 NOTE — TOC Transition Note (Signed)
 Transition of Care [facility]) - Discharge Note   Patient Details  Name: Johnny Golden MRN: [id number] Date of Birth: [birth year]  Transition of Care [facility]) CM/SW Contact:  NORMAN ASPEN, LCSW Phone Number: 06/17/2024, 1:45 PM   Clinical Narrative:     Pt medically cleared for [facility] to [facility] and have insurance authorization.  Pt and wife aware and agreeable.  [facility] called at 1:40 pm.  RN to call report to [phone] ext. [phone].  No further IP CM needs.  Final next level of care: Skilled Nursing Facility Barriers to Discharge: Barriers Resolved   Patient Goals and CMS Choice Patient states their goals for this hospitalization and ongoing recovery are:: return home          Discharge Placement PASRR number recieved: 06/15/24            Patient chooses bed at: Other - please specify in the comment section below: [facility]) Patient to be transferred to facility by: PT[facility] Name of family member notified: wife Patient and family notified of of transfer: 06/17/24  Discharge Plan and Services Additional resources added to the After Visit Summary for                  DME Arranged: N/A DME Agency: NA                  Social Drivers of Health (SDOH) Interventions SDOH Screenings   Food Insecurity: No Food Insecurity (06/12/2024)  Housing: Low Risk (06/12/2024)  Transportation Needs: No Transportation Needs (06/12/2024)  Utilities: Patient Declined (06/12/2024)  Social Connections: Unknown (06/12/2024)  Tobacco Use: Medium Risk (06/12/2024)     Readmission Risk Interventions    06/15/2024    3:10 PM  Readmission Risk Prevention Plan  Post Dischage Appt Complete  Medication Screening Complete  Transportation Screening Complete

## 2024-06-17 NOTE — Progress Notes (Signed)
 Report called to Brandi at [facility]. Patient waiting for [facility] to transport. Donzell Barge, RN

## 2024-06-17 NOTE — Progress Notes (Signed)
" ° °  Subjective: 3 Days Post-Op Procedures (LRB): INSERTION, INTRAMEDULLARY ROD, FEMUR (Left) KNEE INTRA-ARTICULAR INJECTION (Right) Patient reports pain as mild.   Patient seen in rounds for Dr. Ernie. Patient is resting in bed on exam this morning. No acute events overnight. He will d/c today to SNF. We will start therapy today.   Objective: Vital signs in last 24 hours: Temp:  [97.7 F (36.5 C)-97.8 F (36.6 C)] 97.8 F (36.6 C) (02/04 0524) Pulse Rate:  [66-77] 67 (02/04 0524) Resp:  [17-18] 18 (02/04 0524) BP: (124-152)/(69-95) 124/69 (02/04 0524) SpO2:  [97 %-98 %] 98 % (02/04 0524)  Intake/Output from previous day:  Intake/Output Summary (Last 24 hours) at 06/17/2024 0804 Last data filed at 06/17/2024 0600 Gross per 24 hour  Intake 1030 ml  Output 1450 ml  Net -420 ml     Intake/Output this shift: No intake/output data recorded.  Labs: Recent Labs    06/15/24 0326 06/16/24 0255 06/17/24 0322  HGB 11.0* 10.4* 10.7*   Recent Labs    06/16/24 0255 06/17/24 0322  WBC 15.2* 15.4*  RBC 3.08* 3.16*  HCT 31.3* 32.6*  PLT 174 204   Recent Labs    06/16/24 0447 06/17/24 0322  NA 136 138  K 4.1 4.2  CL 100 99  CO2 29 30  BUN 30* 38*  CREATININE 0.91 0.88  GLUCOSE 158* 150*  CALCIUM 9.2 8.9   No results for input(s): LABPT, INR in the last 72 hours.  Exam: General - Patient is Alert and Appropriate Extremity - Neurologically intact Intact pulses distally Dorsiflexion/Plantar flexion intact Dressing - dressing C/D/I Motor Function - intact, moving foot and toes well on exam.   Past Medical History:  Diagnosis Date   Leg pain, bilateral    Spinal stenosis     Assessment/Plan: 3 Days Post-Op Procedures (LRB): INSERTION, INTRAMEDULLARY ROD, FEMUR (Left) KNEE INTRA-ARTICULAR INJECTION (Right) Principal Problem:   Hip fracture (HCC)  Estimated body mass index is 34.03 kg/m as calculated from the following:   Height as of this encounter: 6' 1  (1.854 m).   Weight as of this encounter: 117 kg. Advance diet Up with therapy  DVT Prophylaxis - Aspirin  PWB  D/C to SNF today Rx printed F/U 2 weeks   Rosina Calin, PA-C Orthopedic Surgery [phone] 06/17/2024, 8:04 AM  "

## 2024-06-17 NOTE — Progress Notes (Signed)
 Physical Therapy Treatment Patient Details Name: Johnny Golden MRN: [id number] DOB: [birth year] Today's Date: 06/17/2024   History of Present Illness 79 year old male comes into the hospital with a fall, slipped on ice, found to have left hip fracture. s/p IM rod and R knee injection 06/14/24. with hypertension, R knee osteoarthritis, spinal stenosis.    PT Comments  Pt is progressing well with mobility, he ambulated 40' with RW, no loss of balance, with good adherence to 50% PWB status. Reviewed HEP, pt demonstrates good understanding.     If plan is discharge home, recommend the following: A lot of help with walking and/or transfers;A lot of help with bathing/dressing/bathroom;Assistance with cooking/housework;Assist for transportation;Help with stairs or ramp for entrance   Can travel by private vehicle     No  Equipment Recommendations  Rolling walker (2 wheels)    Recommendations for Other Services       Precautions / Restrictions Precautions Precautions: Fall Recall of Precautions/Restrictions: Intact Precaution/Restrictions Comments: denies other falls in past 6 months Restrictions Weight Bearing Restrictions Per Provider Order: Yes LLE Weight Bearing Per Provider Order: Partial weight bearing LLE Partial Weight Bearing Percentage or Pounds: 50%     Mobility  Bed Mobility Overal bed mobility: Needs Assistance Bed Mobility: Supine to Sit     Supine to sit: Mod assist     General bed mobility comments: assist to raise trunk, VCs for technique    Transfers Overall transfer level: Needs assistance Equipment used: Rolling walker (2 wheels) Transfers: Sit to/from Stand Sit to Stand: Min assist, From elevated surface           General transfer comment: VCs hand placement and technique, min A to power up from elevated bed    Ambulation/Gait Ambulation/Gait assistance: Contact guard assist Gait Distance (Feet): 12 Feet Assistive device: Rolling walker (2  wheels) Gait Pattern/deviations: Step-to pattern Gait velocity: decr     General Gait Details: VCs sequencing, good adherence to PWB status, no loss of balance   Stairs             Wheelchair Mobility     Tilt Bed    Modified Rankin (Stroke Patients Only)       Balance Overall balance assessment: Needs assistance   Sitting balance-Leahy Scale: Good     Standing balance support: Bilateral upper extremity supported, During functional activity, Reliant on assistive device for balance Standing balance-Leahy Scale: Poor                              Communication Communication Communication: No apparent difficulties  Cognition Arousal: Alert Behavior During Therapy: WFL for tasks assessed/performed   PT - Cognitive impairments: No apparent impairments                         Following commands: Intact      Cueing Cueing Techniques: Verbal cues, Tactile cues  Exercises General Exercises - Lower Extremity Ankle Circles/Pumps: AROM, Both, Supine, 15 reps Quad Sets: AROM, Both, 5 reps, Supine Short Arc Quad: AROM, Left, Supine, 10 reps Long Arc Quad: 5 reps Heel Slides: AAROM, Left, Supine, 10 reps Hip ABduction/ADduction: AAROM, Left, Supine, 10 reps    General Comments        Pertinent Vitals/Pain Pain Assessment Pain Score: 4  Pain Location: L hip Pain Descriptors / Indicators: Sore, Grimacing, Operative site guarding Pain Intervention(s): Limited activity within patient's tolerance, Monitored during session,  Premedicated before session, Ice applied, Repositioned    Home Living                          Prior Function            PT Goals (current goals can now be found in the care plan section) Acute Rehab PT Goals Patient Stated Goal: go fishing PT Goal Formulation: With patient/family Time For Goal Achievement: 06/29/24 Potential to Achieve Goals: Good Progress towards PT goals: Progressing toward goals     Frequency    Min 3X/week      PT Plan      Co-evaluation              AM-PAC PT 6 Clicks Mobility   Outcome Measure  Help needed turning from your back to your side while in a flat bed without using bedrails?: A Little Help needed moving from lying on your back to sitting on the side of a flat bed without using bedrails?: A Lot Help needed moving to and from a bed to a chair (including a wheelchair)?: A Little Help needed standing up from a chair using your arms (e.g., wheelchair or bedside chair)?: A Little Help needed to walk in hospital room?: A Little Help needed climbing 3-5 steps with a railing? : A Lot 6 Click Score: 16    End of Session Equipment Utilized During Treatment: Gait belt Activity Tolerance: Patient tolerated treatment well Patient left: in chair;with chair alarm set;with call bell/phone within reach Nurse Communication: Mobility status PT Visit Diagnosis: Muscle weakness (generalized) (M62.81);Difficulty in walking, not elsewhere classified (R26.2);Pain;History of falling (Z91.81);Other abnormalities of gait and mobility (R26.89) Pain - Right/Left: Left Pain - part of body: Hip     Time: 0910-0928 PT Time Calculation (min) (ACUTE ONLY): 18 min  Charges:    $Gait Training: 8-22 mins PT General Charges $$ ACUTE PT VISIT: 1 Visit                     Sylvan Delon Copp PT 06/17/2024  Acute Rehabilitation Services  Office [phone]
